# Patient Record
Sex: Female | Born: 1950 | Race: White | Hispanic: No | Smoking: Current every day smoker
Health system: Southern US, Community
[De-identification: ages and names within clinical notes are randomized; demographics above are authoritative.]

## PROBLEM LIST (undated history)

## (undated) DIAGNOSIS — M1990 Unspecified osteoarthritis, unspecified site: Secondary | ICD-10-CM

## (undated) DIAGNOSIS — J439 Emphysema, unspecified: Secondary | ICD-10-CM

## (undated) DIAGNOSIS — F32A Depression, unspecified: Secondary | ICD-10-CM

## (undated) DIAGNOSIS — F329 Major depressive disorder, single episode, unspecified: Secondary | ICD-10-CM

## (undated) DIAGNOSIS — R3911 Hesitancy of micturition: Secondary | ICD-10-CM

## (undated) DIAGNOSIS — R0600 Dyspnea, unspecified: Secondary | ICD-10-CM

## (undated) DIAGNOSIS — F419 Anxiety disorder, unspecified: Secondary | ICD-10-CM

## (undated) DIAGNOSIS — I214 Non-ST elevation (NSTEMI) myocardial infarction: Secondary | ICD-10-CM

## (undated) DIAGNOSIS — J449 Chronic obstructive pulmonary disease, unspecified: Secondary | ICD-10-CM

## (undated) DIAGNOSIS — I10 Essential (primary) hypertension: Secondary | ICD-10-CM

## (undated) HISTORY — PX: NO PAST SURGERIES: SHX2092

---

## 2017-12-02 ENCOUNTER — Encounter (HOSPITAL_COMMUNITY): Payer: Self-pay | Admitting: *Deleted

## 2017-12-02 ENCOUNTER — Inpatient Hospital Stay (HOSPITAL_COMMUNITY)
Admission: AD | Admit: 2017-12-02 | Discharge: 2017-12-05 | DRG: 280 | Disposition: A | Discharge status: Home / Self Care | Payer: Medicaid Other | Source: Other Acute Inpatient Hospital | Attending: Internal Medicine | Admitting: Internal Medicine

## 2017-12-02 ENCOUNTER — Other Ambulatory Visit: Payer: Self-pay

## 2017-12-02 DIAGNOSIS — F17210 Nicotine dependence, cigarettes, uncomplicated: Secondary | ICD-10-CM | POA: Diagnosis present

## 2017-12-02 DIAGNOSIS — E875 Hyperkalemia: Secondary | ICD-10-CM | POA: Diagnosis present

## 2017-12-02 DIAGNOSIS — Z22322 Carrier or suspected carrier of Methicillin resistant Staphylococcus aureus: Secondary | ICD-10-CM

## 2017-12-02 DIAGNOSIS — Z716 Tobacco abuse counseling: Secondary | ICD-10-CM | POA: Diagnosis not present

## 2017-12-02 DIAGNOSIS — D72829 Elevated white blood cell count, unspecified: Secondary | ICD-10-CM | POA: Diagnosis present

## 2017-12-02 DIAGNOSIS — F32A Depression, unspecified: Secondary | ICD-10-CM | POA: Diagnosis present

## 2017-12-02 DIAGNOSIS — I214 Non-ST elevation (NSTEMI) myocardial infarction: Secondary | ICD-10-CM

## 2017-12-02 DIAGNOSIS — J9601 Acute respiratory failure with hypoxia: Secondary | ICD-10-CM

## 2017-12-02 DIAGNOSIS — R739 Hyperglycemia, unspecified: Secondary | ICD-10-CM | POA: Diagnosis present

## 2017-12-02 DIAGNOSIS — F329 Major depressive disorder, single episode, unspecified: Secondary | ICD-10-CM | POA: Diagnosis present

## 2017-12-02 DIAGNOSIS — I10 Essential (primary) hypertension: Secondary | ICD-10-CM | POA: Diagnosis present

## 2017-12-02 DIAGNOSIS — R945 Abnormal results of liver function studies: Secondary | ICD-10-CM

## 2017-12-02 DIAGNOSIS — Z720 Tobacco use: Secondary | ICD-10-CM | POA: Diagnosis not present

## 2017-12-02 DIAGNOSIS — D649 Anemia, unspecified: Secondary | ICD-10-CM | POA: Diagnosis not present

## 2017-12-02 DIAGNOSIS — Z8249 Family history of ischemic heart disease and other diseases of the circulatory system: Secondary | ICD-10-CM | POA: Diagnosis not present

## 2017-12-02 DIAGNOSIS — F419 Anxiety disorder, unspecified: Secondary | ICD-10-CM | POA: Diagnosis present

## 2017-12-02 DIAGNOSIS — R918 Other nonspecific abnormal finding of lung field: Secondary | ICD-10-CM | POA: Diagnosis present

## 2017-12-02 DIAGNOSIS — Z66 Do not resuscitate: Secondary | ICD-10-CM | POA: Diagnosis present

## 2017-12-02 DIAGNOSIS — Z801 Family history of malignant neoplasm of trachea, bronchus and lung: Secondary | ICD-10-CM

## 2017-12-02 DIAGNOSIS — I21A1 Myocardial infarction type 2: Principal | ICD-10-CM | POA: Diagnosis present

## 2017-12-02 DIAGNOSIS — E876 Hypokalemia: Secondary | ICD-10-CM | POA: Diagnosis present

## 2017-12-02 DIAGNOSIS — J9621 Acute and chronic respiratory failure with hypoxia: Secondary | ICD-10-CM | POA: Diagnosis present

## 2017-12-02 DIAGNOSIS — R740 Nonspecific elevation of levels of transaminase and lactic acid dehydrogenase [LDH]: Secondary | ICD-10-CM | POA: Diagnosis present

## 2017-12-02 DIAGNOSIS — R748 Abnormal levels of other serum enzymes: Secondary | ICD-10-CM | POA: Diagnosis not present

## 2017-12-02 DIAGNOSIS — J441 Chronic obstructive pulmonary disease with (acute) exacerbation: Secondary | ICD-10-CM | POA: Diagnosis present

## 2017-12-02 DIAGNOSIS — R0602 Shortness of breath: Secondary | ICD-10-CM | POA: Diagnosis not present

## 2017-12-02 DIAGNOSIS — R7989 Other specified abnormal findings of blood chemistry: Secondary | ICD-10-CM

## 2017-12-02 DIAGNOSIS — R778 Other specified abnormalities of plasma proteins: Secondary | ICD-10-CM

## 2017-12-02 DIAGNOSIS — R0600 Dyspnea, unspecified: Secondary | ICD-10-CM

## 2017-12-02 HISTORY — DX: Depression, unspecified: F32.A

## 2017-12-02 HISTORY — DX: Emphysema, unspecified: J43.9

## 2017-12-02 HISTORY — DX: Anxiety disorder, unspecified: F41.9

## 2017-12-02 HISTORY — DX: Non-ST elevation (NSTEMI) myocardial infarction: I21.4

## 2017-12-02 HISTORY — DX: Dyspnea, unspecified: R06.00

## 2017-12-02 HISTORY — DX: Unspecified osteoarthritis, unspecified site: M19.90

## 2017-12-02 HISTORY — DX: Essential (primary) hypertension: I10

## 2017-12-02 HISTORY — DX: Chronic obstructive pulmonary disease, unspecified: J44.9

## 2017-12-02 HISTORY — DX: Hesitancy of micturition: R39.11

## 2017-12-02 HISTORY — DX: Major depressive disorder, single episode, unspecified: F32.9

## 2017-12-02 LAB — BLOOD GAS, ARTERIAL
Acid-base deficit: 0.4 mmol/L (ref 0.0–2.0)
Bicarbonate: 23.4 mmol/L (ref 20.0–28.0)
DELIVERY SYSTEMS: POSITIVE
DRAWN BY: 249101
Expiratory PAP: 5
FIO2: 30
Inspiratory PAP: 10
LHR: 10 {breaths}/min
O2 Saturation: 98.7 %
PCO2 ART: 35.8 mmHg (ref 32.0–48.0)
Patient temperature: 98.6
pH, Arterial: 7.431 (ref 7.350–7.450)
pO2, Arterial: 136 mmHg — ABNORMAL HIGH (ref 83.0–108.0)

## 2017-12-02 LAB — TROPONIN I: Troponin I: 2.1 ng/mL (ref <0.03)

## 2017-12-02 LAB — BRAIN NATRIURETIC PEPTIDE: B Natriuretic Peptide: 680.9 pg/mL — ABNORMAL HIGH (ref 0.0–100.0)

## 2017-12-02 MED ORDER — HYDRALAZINE HCL 20 MG/ML IJ SOLN: 5.0000 mg | INTRAMUSCULAR | Status: DC | PRN

## 2017-12-02 MED ORDER — ALPRAZOLAM 0.25 MG PO TABS
0.2500 mg | ORAL_TABLET | Freq: Two times a day (BID) | ORAL | Status: DC | PRN
  Administered 2017-12-03: 0.25 mg via ORAL
  Filled 2017-12-02: qty 1

## 2017-12-02 MED ORDER — TRAZODONE HCL 50 MG PO TABS
25.0000 mg | ORAL_TABLET | Freq: Every day | ORAL | Status: DC
  Administered 2017-12-02 – 2017-12-04 (×3): 25 mg via ORAL
  Filled 2017-12-02 (×3): qty 1

## 2017-12-02 MED ORDER — ONDANSETRON HCL 4 MG/2ML IJ SOLN
4.0000 mg | Freq: Four times a day (QID) | INTRAMUSCULAR | Status: DC | PRN
  Administered 2017-12-03: 4 mg via INTRAVENOUS
  Filled 2017-12-02: qty 2

## 2017-12-02 MED ORDER — BUPROPION HCL ER (XL) 150 MG PO TB24
300.0000 mg | ORAL_TABLET | Freq: Every day | ORAL | Status: DC
  Administered 2017-12-03 – 2017-12-05 (×3): 300 mg via ORAL
  Filled 2017-12-02 (×3): qty 2

## 2017-12-02 MED ORDER — MOMETASONE FURO-FORMOTEROL FUM 100-5 MCG/ACT IN AERO
2.0000 | INHALATION_SPRAY | Freq: Two times a day (BID) | RESPIRATORY_TRACT | Status: DC
  Administered 2017-12-03 – 2017-12-05 (×5): 2 via RESPIRATORY_TRACT
  Filled 2017-12-02: qty 8.8

## 2017-12-02 MED ORDER — NICOTINE 21 MG/24HR TD PT24
21.0000 mg | MEDICATED_PATCH | Freq: Every day | TRANSDERMAL | Status: DC
  Administered 2017-12-04: 21 mg via TRANSDERMAL
  Filled 2017-12-02 (×3): qty 1

## 2017-12-02 MED ORDER — DM-GUAIFENESIN ER 30-600 MG PO TB12
1.0000 | ORAL_TABLET | Freq: Two times a day (BID) | ORAL | Status: DC | PRN
  Filled 2017-12-02: qty 1

## 2017-12-02 MED ORDER — ACETAMINOPHEN 325 MG PO TABS: 650.0000 mg | ORAL_TABLET | ORAL | Status: DC | PRN

## 2017-12-02 MED ORDER — HEPARIN BOLUS VIA INFUSION
3500.0000 [IU] | Freq: Once | INTRAVENOUS | Status: AC
  Administered 2017-12-02: 23:00:00 3500 [IU] via INTRAVENOUS
  Filled 2017-12-02: qty 3500

## 2017-12-02 MED ORDER — ZOLPIDEM TARTRATE 5 MG PO TABS: 5.0000 mg | ORAL_TABLET | Freq: Every evening | ORAL | Status: DC | PRN

## 2017-12-02 MED ORDER — MORPHINE SULFATE (PF) 2 MG/ML IV SOLN
1.0000 mg | INTRAVENOUS | Status: DC | PRN
Start: 2017-12-02 — End: 2017-12-05

## 2017-12-02 MED ORDER — NITROGLYCERIN 0.4 MG SL SUBL: 0.4000 mg | SUBLINGUAL_TABLET | SUBLINGUAL | Status: DC | PRN

## 2017-12-02 MED ORDER — ATORVASTATIN CALCIUM 80 MG PO TABS
80.0000 mg | ORAL_TABLET | Freq: Every day | ORAL | Status: DC
  Administered 2017-12-02 – 2017-12-04 (×3): 80 mg via ORAL
  Filled 2017-12-02 (×3): qty 1

## 2017-12-02 MED ORDER — HEPARIN (PORCINE) IN NACL 100-0.45 UNIT/ML-% IJ SOLN
900.0000 [IU]/h | INTRAMUSCULAR | Status: DC
  Administered 2017-12-02: 900 [IU]/h via INTRAVENOUS
  Filled 2017-12-02: qty 250

## 2017-12-02 MED ORDER — ASPIRIN EC 325 MG PO TBEC
325.0000 mg | DELAYED_RELEASE_TABLET | Freq: Every day | ORAL | Status: DC
Start: 2017-12-02 — End: 2017-12-03
  Administered 2017-12-02 – 2017-12-03 (×2): 325 mg via ORAL
  Filled 2017-12-02 (×2): qty 1

## 2017-12-02 MED ORDER — SODIUM CHLORIDE 0.9 % IV SOLN
INTRAVENOUS | Status: DC
  Administered 2017-12-02 – 2017-12-04 (×2): via INTRAVENOUS

## 2017-12-02 MED ORDER — ALBUTEROL SULFATE (2.5 MG/3ML) 0.083% IN NEBU
2.5000 mg | INHALATION_SOLUTION | RESPIRATORY_TRACT | Status: DC | PRN
  Administered 2017-12-05: 2.5 mg via RESPIRATORY_TRACT
  Filled 2017-12-02: qty 3

## 2017-12-02 MED ORDER — IPRATROPIUM-ALBUTEROL 0.5-2.5 (3) MG/3ML IN SOLN
3.0000 mL | RESPIRATORY_TRACT | Status: DC
  Administered 2017-12-02 – 2017-12-03 (×2): 3 mL via RESPIRATORY_TRACT
  Filled 2017-12-02 (×2): qty 3

## 2017-12-02 MED ORDER — SERTRALINE HCL 100 MG PO TABS
100.0000 mg | ORAL_TABLET | Freq: Every day | ORAL | Status: DC
  Administered 2017-12-03 – 2017-12-05 (×3): 100 mg via ORAL
  Filled 2017-12-02 (×3): qty 1

## 2017-12-02 MED ORDER — METHYLPREDNISOLONE SODIUM SUCC 125 MG IJ SOLR
60.0000 mg | Freq: Three times a day (TID) | INTRAMUSCULAR | Status: DC
  Administered 2017-12-02 – 2017-12-03 (×3): 60 mg via INTRAVENOUS
  Filled 2017-12-02 (×3): qty 2

## 2017-12-02 NOTE — H&P (Signed)
History and Physical    Jessica CandleKathy Reichl WUJ:[id number] DOB: 05/08/1951 DOA: 12/02/2017  Referring MD/NP/PA:   PCP: Anselmo PicklerAchreja, Manjeet Kaur, MD   Patient coming from:  The patient is coming from home.  At baseline, pt is independent for most of ADL. SNF  Assistant living facility   Retirement center.       Chief Complaint: SOB  HPI: Jessica Sawyer is a 67 y.o. female with medical history significant of COPD, anxiety disorder, tobacco abuse and depression with no previous CAD history, who presented with SOB.   [facility] due to non-STEMI.  Patient has been having shortness of breath in the past several weeks, which has been progressively getting worse. Patient denies any chest pain. She has dry cough, but no fever or chills.  She has diaphoresis and weakness in both legs.  [facility] and was found to have NSTEMI with trop 0.11-->1.09. Pt is tachypneic and has oxygen desaturation to 88% on room air. She was put on BiPAP which greatly improved her oxygenation and rated breathing. An ABG was checked while on BiPAP noting a pH of 7.34 and a pCO2 of 44 with PO2 of 130. Patient's chest x-ray noted no evidence of infiltrate, but did note emphysematous changes. A procalcitonin level was normal. Lactic acid level at 1.6. Other labs of note was white count of 17 and cre 0.90. Patient was given dose of Solu-Medrol nebulizer treatments and was able to be weaned off the BiPAP on to 2 L nasal cannula oxygen. Reported EKG noted no signs of ST changes. [facility] at [facility] (Dr.Crenshaw) was consulted, who agreed patient should be assessed further and accepted the patient for [facility]. Pt was started with IV heparin.  When I saw pt on the floor, pt patient has shortness of breath and is in respiratory distress. She can barely speaking full sentence. She has dry cough, no chest pain, fever or chills.  She denies nausea, vomiting, diarrhea,  abdominal pain, symptoms of UTI or unilateral weakness. She states that she is absolutely sure that she wants to be DNR.  [facility]: 1. This was a technically difficult study with suboptimal views. 2. Overall left ventricular systolic function is low-normal with, an EF between 50 - 55 %. 3. The diastolic filling pattern indicates impaired relaxation. 4. Basal anteroseptal LV wall motion is akinetic. 5. Mid anteroseptal LV wall motion is akinetic. 6. Apical septum LV wall motion is hypokinetic. 7. Unable to estimate RVSP due to inadequate TR jet spectral doppler profile. 8. There is no pericardial effusion.  Review of Systems:   General: no fevers, chills, no body weight gain, has poor appetite, has fatigue HEENT: no blurry vision, hearing changes or sore throat Respiratory: has dyspnea, coughing, wheezing CV: no chest pain, no palpitations GI: no nausea, vomiting, abdominal pain, diarrhea, constipation GU: no dysuria, burning on urination, increased urinary frequency, hematuria  Ext: no leg edema Neuro: no unilateral weakness, numbness, or tingling, no vision change or hearing loss Skin: no rash, no skin tear. MSK: No muscle spasm, no deformity, no limitation of range of movement in spin Heme: No easy bruising.  Travel history: No recent long distant travel.  Allergy: No Known Allergies  Past Medical History:  Diagnosis Date  . Anxiety   . Arthritis    "neck, hands, shoulders" (12/02/2017)  . COPD (chronic obstructive pulmonary disease) (HCC)   . Depression   .  Dyspnea   . Emphysema lung (HCC)   . Hypertension   . NSTEMI (non-ST elevated myocardial infarction) (HCC) 12/02/2017   /pt transfer form 12/02/2017  . Urinary hesitancy     Past Surgical History:  Procedure Laterality Date  . NO PAST SURGERIES      Social History:  reports that she has been smoking.  She has a 78.00 pack-year smoking history. She has never used smokeless tobacco. She  reports that she drank alcohol. She reports that she does not use drugs.  Family History:  Family History  Problem Relation Age of Onset  . Lung cancer Father   . Breast cancer Sister      Prior to Admission medications   Not on File    Physical Exam: Vitals:   12/02/17 2113 12/02/17 2218  BP: 120/67   Pulse: 91   Resp: (!) 29   Temp: 97.7 F (36.5 C)   TempSrc: Oral   SpO2: 98%   Weight:  63.9 kg (140 lb 14 oz)   General: in moderate respiratory acute distress HEENT:       Eyes: PERRL, EOMI, no scleral icterus.       ENT: No discharge from the ears and nose, no pharynx injection, no tonsillar enlargement.        Neck: No JVD, no bruit, no mass felt. Heme: No neck lymph node enlargement. Cardiac: S1/S2, RRR, No murmurs, No gallops or rubs. Respiratory: decresedd air movement bilaterally, wheezing bilaterally. GI: Soft, nondistended, nontender, no rebound pain, no organomegaly, BS present. GU: No hematuria Ext: No pitting leg edema bilaterally. 2+DP/PT pulse bilaterally. Musculoskeletal: No joint deformities, No joint redness or warmth, no limitation of ROM in spin. Skin: No rashes.  Neuro: Alert, oriented X3, cranial nerves II-XII grossly intact, moves all extremities normally. Psych: Patient is not psychotic, no suicidal or hemocidal ideation.  Labs on Admission: I have personally reviewed following labs and imaging studies  CBC: No results for input(s): WBC, NEUTROABS, HGB, HCT, MCV, PLT in the last 168 hours. Basic Metabolic Panel: No results for input(s): NA, K, CL, CO2, GLUCOSE, BUN, CREATININE, CALCIUM, MG, PHOS in the last 168 hours. GFR: CrCl cannot be calculated (No order found.). Liver Function Tests: No results for input(s): AST, ALT, ALKPHOS, BILITOT, PROT, ALBUMIN in the last 168 hours. No results for input(s): LIPASE, AMYLASE in the last 168 hours. No results for input(s): AMMONIA in the last 168 hours. Coagulation Profile: No results for input(s):  INR, PROTIME in the last 168 hours. Cardiac Enzymes: No results for input(s): CKTOTAL, CKMB, CKMBINDEX, TROPONINI in the last 168 hours. BNP (last 3 results) No results for input(s): PROBNP in the last 8760 hours. HbA1C: No results for input(s): HGBA1C in the last 72 hours. CBG: No results for input(s): GLUCAP in the last 168 hours. Lipid Profile: No results for input(s): CHOL, HDL, LDLCALC, TRIG, CHOLHDL, LDLDIRECT in the last 72 hours. Thyroid Function Tests: No results for input(s): TSH, T4TOTAL, FREET4, T3FREE, THYROIDAB in the last 72 hours. Anemia Panel: No results for input(s): VITAMINB12, FOLATE, FERRITIN, TIBC, IRON, RETICCTPCT in the last 72 hours. Urine analysis: No results found for: COLORURINE, APPEARANCEUR, LABSPEC, PHURINE, GLUCOSEU, HGBUR, BILIRUBINUR, KETONESUR, PROTEINUR, UROBILINOGEN, NITRITE, LEUKOCYTESUR Sepsis Labs: @LABRCNTIP (procalcitonin:4,lacticidven:4) )No results found for this or any previous visit (from the past 240 hour(s)).   Radiological Exams on Admission: No results found.   EKG: will get one now  Assessment/Plan Principal Problem:   NSTEMI (non-ST elevated myocardial infarction) [facility]) Active Problems:  COPD with acute exacerbation (HCC)   Anxiety   Depression   Hypertension   Tobacco abuse   Acute on chronic respiratory failure with hypoxia (HCC)   Non ST elevated MI: trop 0.11-->1.09. Cardiology at [facility]) was consulted-->possible cath on Monday.  The patient does not have chest pain, but has shortness of breath which is likely mainly due to COPD exacerbation.  - will admit to SDU as inpt - cycle CE q6 x3 and repeat EKG  - prn Nitroglycerin, Morphine, and aspirin, lipitor  - Risk factor stratification: will check FLP, UDS and A1C  -f/u card recommendations  Acute on chronic respiratory failure with hypoxia due to acute COPD exacerbation with hypoxia: Normal procalcitonin. CXR has no infiltration, no need for  antibiotics now. -start BiPAP and get ABG -Nebulizers: scheduled Duoneb and prn albuterol -Dulera inhaler -Solu-Medrol 60 mg IV tid -Mucinex for cough  -Incentive spirometry -Follow up blood culture x2, respiratory virus panel -Nasal cannula oxygen as needed to maintain O2 saturation 92% or greater   Hyperglycemia: Noted glucose of 176. No history of diabetes.  -Will check an A1c.   History of anxiety and depression:  -Continue home medications: PRN Xanax, Buproban, Zoloft, trazodone  Tobacco abuse:  -Nicotine patch   Suspicious lymph nodes: per discharge summary from [place], "prior to admission, patient underwent a PET scan done at [facility] on 6/27. Apparently patient had a screening CT which noted bilateral pulmonary nodules. Patient's PET scan noted 3 pulmonary nodules which were hypermetabolic the most suspicious being the right apex and considered for tissue sampling versus follow-up chest CT in 3 months. No evidence of thoracic nodule or hypermetabolic extra thoracic disease. Upon discharge, patient will need to follow up with [facility]" .  Leukocytosis: WBC 17.0.  No fever.  Likely due to steroid use. -Check urinalysis -Blood culture    DVT ppx: On IV Heparin    Code Status: DNR (I discussed with patient, and explained the meaning of CODE STATUS. Patient states that she is absolutely sure that she wants to be DNR) Family Communication: None at bed side.    Disposition Plan:  Anticipate discharge back to previous home environment Consults called:  Card Admission status:  SDU/inpation       Date of Service 12/02/2017    Lorretta Harp [facility] Pager [phone]  If 7PM-7AM, please contact night-coverage [email or web address] Password [facility] 12/02/2017, 10:45 PM

## 2017-12-02 NOTE — Progress Notes (Signed)
ANTICOAGULATION CONSULT NOTE - Initial Consult  Pharmacy Consult for heparin Indication: chest pain/ACS  No Known Allergies  Patient Measurements:   Heparin Dosing Weight:  Vital Signs: Temp: 97.7 F (36.5 C) (06/28 2113) Temp Source: Oral (06/28 2113) BP: 120/67 (06/28 2113) Pulse Rate: 91 (06/28 2113)  Labs: No results for input(s): HGB, HCT, PLT, APTT, LABPROT, INR, HEPARINUNFRC, HEPRLOWMOCWT, CREATININE, CKTOTAL, CKMB, TROPONINI in the last 72 hours.  CrCl cannot be calculated (No order found.).   Medical History: Past Medical History:  Diagnosis Date  . Anxiety   . Arthritis    "neck, hands, shoulders" (12/02/2017)  . COPD (chronic obstructive pulmonary disease) (HCC)   . Depression   . Dyspnea   . Emphysema lung (HCC)   . Hypertension   . NSTEMI (non-ST elevated myocardial infarction) (HCC) 12/02/2017   /pt transfer form 12/02/2017  . Urinary hesitancy    Assessment: 67 year old female admitted for progressive shortness of breath and weakness. Troponin low positive at 0.11 per MD note. Orders to start IV heparin for rule out ACS.   Goal of Therapy:  Heparin level 0.3-0.7 units/ml Monitor platelets by anticoagulation protocol: Yes   Plan:  Give 3500 units bolus x 1 Start heparin infusion at 900 units/hr Check anti-Xa level in 6 hours and daily while on heparin Continue to monitor H&H and platelets  Sheppard CoilFrank Mckinnon Glick PharmD., BCPS Clinical Pharmacist 12/02/2017 10:20 PM

## 2017-12-02 NOTE — Consult Note (Signed)
Cardiology Consultation:   Patient ID: Jessica Sawyer; [id number]; [birth year]   Admit date: 12/02/2017 Date of Consult: 12/02/2017  Primary Care Provider: Anselmo Pickler, MD Primary Cardiologist: Not established Referring Provider: Dr. Clyde Lundborg   Patient Profile:   Jessica Sawyer is a 67 y.o. female with a hx of COPD, tobacco abuse and depression who is being seen today for the evaluation of elevated troponin of 0.11 at the request of Dr. Clyde Lundborg.  History of Present Illness:   Ms. Dipierro was walking down her driveway this morning around 9 AM when she became significantly short of breath. She reports having more shortness of breath over the past couple weeks. She was planning on seeing her PCP this morning but then called EMS after the worsening of her breathing. She has never been hospitalized for any reason. She reports some slight chest tightness but no overt pain and currently feels much better after being given Solumedrol and BIPAP at the outside hospital. She reports a slightly productive cough with clear sputum. She was found to have an elevated troponin of 0.11 and the case was discussed with Dr. Jens Som who recommended transfer and IV heparin until troponin trend is established. She denies any issues with bleeding. She continues to smoke 1.5 ppd. She is listed as DNR.  Past Medical History:  Diagnosis Date  . Anxiety   . Arthritis    "neck, hands, shoulders" (12/02/2017)  . COPD (chronic obstructive pulmonary disease) (HCC)   . Depression   . Dyspnea   . Emphysema lung (HCC)   . Hypertension   . NSTEMI (non-ST elevated myocardial infarction) (HCC) 12/02/2017   /pt transfer form 12/02/2017  . Urinary hesitancy     Past Surgical History:  Procedure Laterality Date  . NO PAST SURGERIES       Inpatient Medications: Scheduled Meds: . aspirin EC  325 mg Oral Daily  . atorvastatin  80 mg Oral q1800  . [START ON 12/03/2017] buPROPion  300 mg Oral Daily  . heparin  3,500 Units  Intravenous Once  . [START ON 12/03/2017] ipratropium-albuterol  3 mL Nebulization Q4H  . methylPREDNISolone (SOLU-MEDROL) injection  60 mg Intravenous TID  . mometasone-formoterol  2 puff Inhalation BID  . [START ON 12/03/2017] nicotine  21 mg Transdermal Daily  . [START ON 12/03/2017] sertraline  100 mg Oral Daily  . traZODone  25 mg Oral QHS   Continuous Infusions: . sodium chloride    . heparin     PRN Meds: acetaminophen, albuterol, ALPRAZolam, dextromethorphan-guaiFENesin, hydrALAZINE, morphine injection, nitroGLYCERIN, ondansetron (ZOFRAN) IV, zolpidem  Allergies:   No Known Allergies  Social History:   Social History   Socioeconomic History  . Marital status: Divorced    Spouse name: Not on file  . Number of children: Not on file  . Years of education: Not on file  . Highest education level: Not on file  Occupational History  . Not on file  Social Needs  . Financial resource strain: Not on file  . Food insecurity:    Worry: Not on file    Inability: Not on file  . Transportation needs:    Medical: Not on file    Non-medical: Not on file  Tobacco Use  . Smoking status: Current Every Day Smoker    Packs/day: 1.50    Years: 52.00    Pack years: 78.00  . Smokeless tobacco: Never Used  Substance and Sexual Activity  . Alcohol use: Not Currently    Frequency: Never  .  Drug use: Never  . Sexual activity: Not Currently    Birth control/protection: None  Lifestyle  . Physical activity:    Days per week: Not on file    Minutes per session: Not on file  . Stress: Not on file  Relationships  . Social connections:    Talks on phone: Not on file    Gets together: Not on file    Attends religious service: Not on file    Active member of club or organization: Not on file    Attends meetings of clubs or organizations: Not on file    Relationship status: Not on file  . Intimate partner violence:    Fear of current or ex partner: Not on file    Emotionally abused: Not  on file    Physically abused: Not on file    Forced sexual activity: Not on file  Other Topics Concern  . Not on file  Social History Narrative  . Not on file     Family History:   Brother: CAD and heart valve Brother: CAD and defibrillator   Family History  Problem Relation Age of Onset  . Lung cancer Father   . Breast cancer Sister      REVIEW OF SYSTEMS (positive in bold, otherwise negative):  Constitutional: change in weight, fever/chills, fatigue, diaphoresis Skin: change in color, rashes, ulcerations or suspicious lesions HENT: head trauma, headache, difficulty hearing, nasal discharge, sore throat Eyes: blurred vision, diplopia Cardiovascular: chest pain, palpitations, syncope, edema, orthopnea, PND Respiratory: pleuritic chest pain, SOB, DOE, cough Gastrointestinal: constipation, abdominal pain, change in stool, nausea, vomiting, diarrhea  Genitourinary: dysuria, hematuria, incontinence, urgency Musculoskeletal: arthritis, muscle stiffness, weakness. Neurological: seizures, focal weakness, sensory change  Physical Exam/Data:   Vitals:   12/02/17 2113 12/02/17 2218  BP: 120/67   Pulse: 91   Resp: (!) 29   Temp: 97.7 F (36.5 C)   TempSrc: Oral   SpO2: 98%   Weight:  63.9 kg (140 lb 14 oz)    Intake/Output Summary (Last 24 hours) at 12/02/2017 2241 Last data filed at 12/02/2017 2122 Gross per 24 hour  Intake -  Output 100 ml  Net -100 ml   Filed Weights   12/02/17 2218  Weight: 63.9 kg (140 lb 14 oz)   General: Alert, cooperative, no distress, appears older than stated age. Frequent movements  Head: Normocephalic, without obvious abnormality, atraumatic.  Eyes: Conjunctivae/corneas clear. PERRL, EOMs intact.   Nose: Nares normal.  Septum midline, Mucosa normal. No drainage or sinus tenderness.  Throat: Lips, mucosa, and tongue normal.  Teeth and gums normal.  Neck: Supple, symmetrical, trachea midline, no adenopathy, no thryroid enlargment, tenderness  or nodules, no carotid bruit and no JVD  Lungs: Diminished breath sounds bilaterally   Heart: Tachycardic, regular, S1, S2 normal, no murmur, click, rub or gallop  Chest Wall: No tenderness.  Abdomen: Soft, non-tender.  Bowel sounds normal.  No masses. No organomegaly.  Extremities: Extremities normal, atraumatic, no cyanosis or edema  Skin: Skin color, texture, turgor normal.  No rashes or lesions.  Neurologic: Nonfocal   Relevant CV Studies: EKG:  NSR with no st changes, HR 86  Echo:  [facility]: 1. This was a technically difficult study with suboptimal views. 2. Overall left ventricular systolic function is low-normal with, an EF between 50 - 55 %. 3. The diastolic filling pattern indicates impaired relaxation. 4. Basal anteroseptal LV wall motion is akinetic. 5. Mid anteroseptal LV wall  motion is akinetic. 6. Apical septum LV wall motion is hypokinetic. 7. Unable to estimate RVSP due to inadequate TR jet spectral doppler profile. 8. There is no pericardial effusion.   Laboratory Data: ABG: 7.34, pCO2 of 44, PO2 130 while on Bipap WBC: 17 Creatinine 0.9 Cardiac Enzymes 0.11 at [facility]  Assessment and Plan:   1. NSTEMI - No overt ST changes by ECG but ECHO suggests basal and mid anteroseptal WMA - Continue to trend tropon's - Continue IV Heparin drip and ASA - Recommend high intensity statin and eventually consider beta blockade - Will consider further invasive testing when respiratory status improves  2. Tobacco abuse - Counseled on cessation   3. Acute on chronic respiratory failure 2/2 Acute COPD exacerbation - Per primary team   For questions or updates, please contact [facility] Please consult [email or web address] for contact info under Cardiology/STEMI.   Signed, Channing Mutters, MD  12/02/2017 10:41 PM

## 2017-12-03 ENCOUNTER — Inpatient Hospital Stay (HOSPITAL_COMMUNITY): Payer: Medicaid Other

## 2017-12-03 LAB — TROPONIN I
TROPONIN I: 1.38 ng/mL — AB (ref <0.03)
Troponin I: 1.68 ng/mL (ref <0.03)

## 2017-12-03 LAB — RESPIRATORY PANEL BY PCR
ADENOVIRUS-RVPPCR: NOT DETECTED
Bordetella pertussis: NOT DETECTED
CORONAVIRUS 229E-RVPPCR: NOT DETECTED
CORONAVIRUS NL63-RVPPCR: NOT DETECTED
CORONAVIRUS OC43-RVPPCR: NOT DETECTED
Chlamydophila pneumoniae: NOT DETECTED
Coronavirus HKU1: NOT DETECTED
INFLUENZA A-RVPPCR: NOT DETECTED
Influenza B: NOT DETECTED
MYCOPLASMA PNEUMONIAE-RVPPCR: NOT DETECTED
Metapneumovirus: NOT DETECTED
PARAINFLUENZA VIRUS 1-RVPPCR: NOT DETECTED
PARAINFLUENZA VIRUS 2-RVPPCR: NOT DETECTED
Parainfluenza Virus 3: NOT DETECTED
Parainfluenza Virus 4: NOT DETECTED
Respiratory Syncytial Virus: NOT DETECTED
Rhinovirus / Enterovirus: NOT DETECTED

## 2017-12-03 LAB — LIPID PANEL
CHOLESTEROL: 259 mg/dL — AB (ref 0–200)
HDL: 93 mg/dL (ref >40)
LDL Cholesterol: 159 mg/dL — ABNORMAL HIGH (ref 0–99)
TRIGLYCERIDES: 35 mg/dL (ref <150)
Total CHOL/HDL Ratio: 2.8 RATIO
VLDL: 7 mg/dL (ref 0–40)

## 2017-12-03 LAB — URINALYSIS, ROUTINE W REFLEX MICROSCOPIC
BACTERIA UA: NONE SEEN
BILIRUBIN URINE: NEGATIVE
Glucose, UA: NEGATIVE mg/dL
KETONES UR: NEGATIVE mg/dL
LEUKOCYTES UA: NEGATIVE
NITRITE: NEGATIVE
PH: 5 (ref 5.0–8.0)
PROTEIN: NEGATIVE mg/dL
Specific Gravity, Urine: 1.012 (ref 1.005–1.030)

## 2017-12-03 LAB — CBC
HCT: 36.3 % (ref 36.0–46.0)
HEMOGLOBIN: 11.7 g/dL — AB (ref 12.0–15.0)
MCH: 31.1 pg (ref 26.0–34.0)
MCHC: 32.2 g/dL (ref 30.0–36.0)
MCV: 96.5 fL (ref 78.0–100.0)
Platelets: 268 10*3/uL (ref 150–400)
RBC: 3.76 MIL/uL — AB (ref 3.87–5.11)
RDW: 13.7 % (ref 11.5–15.5)
WBC: 11.1 10*3/uL — AB (ref 4.0–10.5)

## 2017-12-03 LAB — BASIC METABOLIC PANEL
ANION GAP: 10 (ref 5–15)
BUN: 17 mg/dL (ref 8–23)
CHLORIDE: 102 mmol/L (ref 98–111)
CO2: 25 mmol/L (ref 22–32)
CREATININE: 0.97 mg/dL (ref 0.44–1.00)
Calcium: 8.9 mg/dL (ref 8.9–10.3)
GFR calc non Af Amer: >60 mL/min (ref >60)
Glucose, Bld: 174 mg/dL — ABNORMAL HIGH (ref 70–99)
Potassium: 5.6 mmol/L — ABNORMAL HIGH (ref 3.5–5.1)
SODIUM: 137 mmol/L (ref 135–145)

## 2017-12-03 LAB — RAPID URINE DRUG SCREEN, HOSP PERFORMED
Amphetamines: NOT DETECTED
Benzodiazepines: NOT DETECTED
Cocaine: NOT DETECTED
OPIATES: NOT DETECTED
Tetrahydrocannabinol: NOT DETECTED

## 2017-12-03 LAB — HEMOGLOBIN A1C
HEMOGLOBIN A1C: 5.9 % — AB (ref 4.8–5.6)
Mean Plasma Glucose: 122.63 mg/dL

## 2017-12-03 LAB — HEPARIN LEVEL (UNFRACTIONATED)
HEPARIN UNFRACTIONATED: 0.68 [IU]/mL (ref 0.30–0.70)
Heparin Unfractionated: 1.48 IU/mL — ABNORMAL HIGH (ref 0.30–0.70)

## 2017-12-03 LAB — HIV ANTIBODY (ROUTINE TESTING W REFLEX): HIV Screen 4th Generation wRfx: NONREACTIVE

## 2017-12-03 LAB — MRSA PCR SCREENING: MRSA by PCR: POSITIVE — AB

## 2017-12-03 MED ORDER — CHLORHEXIDINE GLUCONATE CLOTH 2 % EX PADS
6.0000 | MEDICATED_PAD | Freq: Every day | CUTANEOUS | Status: DC
  Administered 2017-12-04 – 2017-12-05 (×2): 6 via TOPICAL

## 2017-12-03 MED ORDER — GUAIFENESIN ER 600 MG PO TB12
600.0000 mg | ORAL_TABLET | Freq: Two times a day (BID) | ORAL | Status: DC
  Administered 2017-12-03 – 2017-12-05 (×4): 600 mg via ORAL
  Filled 2017-12-03 (×4): qty 1

## 2017-12-03 MED ORDER — METHYLPREDNISOLONE SODIUM SUCC 125 MG IJ SOLR
60.0000 mg | Freq: Two times a day (BID) | INTRAMUSCULAR | Status: DC
  Administered 2017-12-04 – 2017-12-05 (×3): 60 mg via INTRAVENOUS
  Filled 2017-12-03 (×3): qty 2

## 2017-12-03 MED ORDER — HEPARIN (PORCINE) IN NACL 100-0.45 UNIT/ML-% IJ SOLN
600.0000 [IU]/h | INTRAMUSCULAR | Status: DC
  Administered 2017-12-03: 750 [IU]/h via INTRAVENOUS
  Filled 2017-12-03: qty 250

## 2017-12-03 MED ORDER — ORAL CARE MOUTH RINSE
15.0000 mL | Freq: Two times a day (BID) | OROMUCOSAL | Status: DC
  Administered 2017-12-03 – 2017-12-04 (×3): 15 mL via OROMUCOSAL

## 2017-12-03 MED ORDER — DOXYCYCLINE HYCLATE 100 MG PO TABS
100.0000 mg | ORAL_TABLET | Freq: Two times a day (BID) | ORAL | Status: DC
  Administered 2017-12-03 – 2017-12-05 (×4): 100 mg via ORAL
  Filled 2017-12-03 (×4): qty 1

## 2017-12-03 MED ORDER — IPRATROPIUM-ALBUTEROL 0.5-2.5 (3) MG/3ML IN SOLN
3.0000 mL | Freq: Three times a day (TID) | RESPIRATORY_TRACT | Status: DC
  Administered 2017-12-03 (×3): 3 mL via RESPIRATORY_TRACT
  Filled 2017-12-03 (×3): qty 3

## 2017-12-03 MED ORDER — ASPIRIN EC 81 MG PO TBEC
81.0000 mg | DELAYED_RELEASE_TABLET | Freq: Every day | ORAL | Status: DC
  Administered 2017-12-04: 81 mg via ORAL
  Filled 2017-12-03: qty 1

## 2017-12-03 MED ORDER — MUPIROCIN 2 % EX OINT
1.0000 "application " | TOPICAL_OINTMENT | Freq: Two times a day (BID) | CUTANEOUS | Status: DC
  Administered 2017-12-03 – 2017-12-05 (×5): 1 via NASAL
  Filled 2017-12-03: qty 22

## 2017-12-03 MED ORDER — CHLORHEXIDINE GLUCONATE 0.12 % MT SOLN
15.0000 mL | Freq: Two times a day (BID) | OROMUCOSAL | Status: DC
  Administered 2017-12-03 – 2017-12-05 (×5): 15 mL via OROMUCOSAL
  Filled 2017-12-03 (×5): qty 15

## 2017-12-03 NOTE — Progress Notes (Signed)
Pt had vomited to previously digested food. Zofran IV given as ordered. EKG done with noted changes compared to previous EKGs. Dr. Garlan FillersMartin,Stephanie  Called and updated . Incoming RN aware.

## 2017-12-03 NOTE — Progress Notes (Signed)
ANTICOAGULATION CONSULT NOTE   Pharmacy Consult for heparin Indication: chest pain/ACS  No Known Allergies  Patient Measurements: Weight: 140 lb 3.4 oz (63.6 kg)  Vital Signs: Temp: 98.8 F (37.1 C) (06/29 2009) Temp Source: Oral (06/29 2009) BP: 164/86 (06/29 2009) Pulse Rate: 113 (06/29 2009)  Labs: Recent Labs    12/02/17 2220 12/03/17 0334 12/03/17 0811 12/03/17 1026 12/03/17 1900  HGB  --  11.7*  --   --   --   HCT  --  36.3  --   --   --   PLT  --  268  --   --   --   HEPARINUNFRC  --   --  1.48*  --  0.68  CREATININE  --  0.97  --   --   --   TROPONINI 2.10* 1.68*  --  1.38*  --    CrCl cannot be calculated (Unknown ideal weight.).  Medical History: Past Medical History:  Diagnosis Date  . Anxiety   . Arthritis    "neck, hands, shoulders" (12/02/2017)  . COPD (chronic obstructive pulmonary disease) (HCC)   . Depression   . Dyspnea   . Emphysema lung (HCC)   . Hypertension   . NSTEMI (non-ST elevated myocardial infarction) (HCC) 12/02/2017   /pt transfer form 12/02/2017  . Urinary hesitancy    Assessment: 67 year old female admitted for progressive shortness of breath and weakness. Troponin positive 1.68  Initial heparin level therapeutic: 0.68, no overt bleeding reported by RN.   Goal of Therapy:  Heparin level 0.3-0.7 units/ml Monitor platelets by anticoagulation protocol: Yes   Plan:  Continue heparin infusion at 750 units/hr Check confirmatory anti-Xa level in 6 hours and daily while on heparin Continue to monitor H&H and platelets  Daren Yeagle A. Jeanella CrazePierce, PharmD, BCPS Clinical Pharmacist [place] Pager: [phone] Please utilize Amion for appropriate phone number to reach the unit pharmacist [facility]   12/03/2017 8:34 PM Please check AMION for all [facility] numbers

## 2017-12-03 NOTE — Progress Notes (Signed)
ANTICOAGULATION CONSULT NOTE   Pharmacy Consult for heparin Indication: chest pain/ACS  No Known Allergies  Patient Measurements: Weight: 176 lb 11.2 oz (80.2 kg)  Vital Signs: Temp: 98 F (36.7 C) (06/29 0801) Temp Source: Oral (06/29 0801) BP: 138/67 (06/29 0801) Pulse Rate: 86 (06/29 0443)  Labs: Recent Labs    12/02/17 2220 12/03/17 0334 12/03/17 0811  HGB  --  11.7*  --   HCT  --  36.3  --   PLT  --  268  --   HEPARINUNFRC  --   --  1.48*  CREATININE  --  0.97  --   TROPONINI 2.10* 1.68*  --    CrCl cannot be calculated (Unknown ideal weight.).  Medical History: Past Medical History:  Diagnosis Date  . Anxiety   . Arthritis    "neck, hands, shoulders" (12/02/2017)  . COPD (chronic obstructive pulmonary disease) (HCC)   . Depression   . Dyspnea   . Emphysema lung (HCC)   . Hypertension   . NSTEMI (non-ST elevated myocardial infarction) (HCC) 12/02/2017   /pt transfer form 12/02/2017  . Urinary hesitancy    Assessment: 67 year old female admitted for progressive shortness of breath and weakness. Troponin positive 1.68  Initial heparin level elevated: 1.48, no overt bleeding reported by RN. Labs collected correctly. Asked to pause infusion and will resume in ~1 hour.  Goal of Therapy:  Heparin level 0.3-0.7 units/ml Monitor platelets by anticoagulation protocol: Yes   Plan:  Reduce heparin infusion to 750 units/hr Check anti-Xa level in 6 hours and daily while on heparin Continue to monitor H&H and platelets  Ruben Imony Huntleigh Doolen, PharmD Clinical Pharmacist 12/03/2017 11:19 AM Please check AMION for all [facility] numbers

## 2017-12-03 NOTE — Progress Notes (Signed)
PROGRESS NOTE  Jessica Sawyer ZOX:[id number] DOB: [birth year] DOA: 12/02/2017 PCP: Anselmo Pickler, MD  HPI/Recap of past 24 hours:   Very pleasant, denies chest pain or sob, no fever, no hypoxia on room air She has intermittent congested cough, she reports it is chronic  Assessment/Plan: Principal Problem:   NSTEMI (non-ST elevated myocardial infarction) (HCC) Active Problems:   COPD with acute exacerbation (HCC)   Anxiety   Depression   Hypertension   Tobacco abuse   Acute on chronic respiratory failure with hypoxia (HCC)  NSTEMI: On heparin drip,asa, statin Cardiology consulted, plan for cardiac cath on Monday, will follow recommendations  Acute hypoxic respiratory failure/copd exacerbation -cxr with copd changes and chronic bronchitis changes -no fever, now on room air at rest -no wheezing today, continue to have congested cough, mrsa screening +, respiratory viral panel negative -continue nebs, taper steroids, add oral doxycycline and mucinex  Mild hyperkalemia: continue hydration, continue nebs, repeat lab  prior to admission, patient underwent a PET scan done at [facility] on 6/27. Apparently patient had a screening CT which noted bilateral pulmonary nodules. Patient's PET scan noted 3 pulmonary nodules which were hypermetabolic the most suspicious being the right apex and considered for tissue sampling versus follow-up chest CT in 3 months. No evidence of thoracic nodule or hypermetabolic extra thoracic disease. Upon discharge, patient will need to follow up with [facility]" .  Tobacco use: Smoking cessation education provided at length>5mins  Code Status: DNR  Family Communication: patient   Disposition Plan: not ready to discharge   Consultants:  cardiology  Procedures:  Plan for cardiac cath on monday  Antibiotics:  Doxycycline from 6/29   Objective: BP 138/67 (BP Location: Right Arm)   Pulse 86   Temp 98 F (36.7  C) (Oral)   Resp 20   Wt 80.2 kg (176 lb 11.2 oz)   SpO2 95%   Intake/Output Summary (Last 24 hours) at 12/03/2017 0811 Last data filed at 12/03/2017 0800 Gross per 24 hour  Intake 660.72 ml  Output 1000 ml  Net -339.28 ml   Filed Weights   12/02/17 2218 12/03/17 0001 12/03/17 0500  Weight: 63.9 kg (140 lb 14 oz) 64.2 kg (141 lb 9.6 oz) 80.2 kg (176 lb 11.2 oz)    Exam: Patient is examined daily including today on 12/03/2017, exams remain the same as of yesterday except that has changed    General:  NAD  Cardiovascular: RRR, distance heart sound  Respiratory: diminished, no wheezing  Abdomen: Soft/ND/NT, positive BS  Musculoskeletal: No Edema  Neuro: alert, oriented   Data Reviewed: Basic Metabolic Panel: Recent Labs  Lab 12/03/17 0334  NA 137  K 5.6*  CL 102  CO2 25  GLUCOSE 174*  BUN 17  CREATININE 0.97  CALCIUM 8.9   Liver Function Tests: No results for input(s): AST, ALT, ALKPHOS, BILITOT, PROT, ALBUMIN in the last 168 hours. No results for input(s): LIPASE, AMYLASE in the last 168 hours. No results for input(s): AMMONIA in the last 168 hours. CBC: Recent Labs  Lab 12/03/17 0334  WBC 11.1*  HGB 11.7*  HCT 36.3  MCV 96.5  PLT 268   Cardiac Enzymes:   Recent Labs  Lab 12/02/17 2220 12/03/17 0334  TROPONINI 2.10* 1.68*   BNP (last 3 results) Recent Labs    12/02/17 2220  BNP 680.9*    ProBNP (last 3 results) No results for input(s): PROBNP in the last 8760 hours.  CBG: No results  for input(s): GLUCAP in the last 168 hours.  Recent Results (from the past 240 hour(s))  Respiratory Panel by PCR     Status: None   Collection Time: 12/02/17 10:51 PM  Result Value Ref Range Status   Adenovirus NOT DETECTED NOT DETECTED Final   Coronavirus 229E NOT DETECTED NOT DETECTED Final   Coronavirus HKU1 NOT DETECTED NOT DETECTED Final   Coronavirus NL63 NOT DETECTED NOT DETECTED Final   Coronavirus OC43 NOT DETECTED NOT DETECTED Final    Metapneumovirus NOT DETECTED NOT DETECTED Final   Rhinovirus / Enterovirus NOT DETECTED NOT DETECTED Final   Influenza A NOT DETECTED NOT DETECTED Final   Influenza B NOT DETECTED NOT DETECTED Final   Parainfluenza Virus 1 NOT DETECTED NOT DETECTED Final   Parainfluenza Virus 2 NOT DETECTED NOT DETECTED Final   Parainfluenza Virus 3 NOT DETECTED NOT DETECTED Final   Parainfluenza Virus 4 NOT DETECTED NOT DETECTED Final   Respiratory Syncytial Virus NOT DETECTED NOT DETECTED Final   Bordetella pertussis NOT DETECTED NOT DETECTED Final   Chlamydophila pneumoniae NOT DETECTED NOT DETECTED Final   Mycoplasma pneumoniae NOT DETECTED NOT DETECTED Final  MRSA PCR Screening     Status: Abnormal   Collection Time: 12/02/17 10:51 PM  Result Value Ref Range Status   MRSA by PCR POSITIVE (A) NEGATIVE Final    Comment:        The GeneXpert MRSA Assay (FDA approved for NASAL specimens only), is one component of a comprehensive MRSA colonization surveillance program. It is not intended to diagnose MRSA infection nor to guide or monitor treatment for MRSA infections. RESULT CALLED TO, READ BACK BY AND VERIFIED WITH: D DUVALL RN 12/03/17 0258 JDW      Studies: No results found.  Scheduled Meds: . aspirin EC  325 mg Oral Daily  . atorvastatin  80 mg Oral q1800  . buPROPion  300 mg Oral Daily  . chlorhexidine  15 mL Mouth Rinse BID  . [START ON 12/04/2017] Chlorhexidine Gluconate Cloth  6 each Topical Q0600  . ipratropium-albuterol  3 mL Nebulization TID  . mouth rinse  15 mL Mouth Rinse q12n4p  . methylPREDNISolone (SOLU-MEDROL) injection  60 mg Intravenous TID  . mometasone-formoterol  2 puff Inhalation BID  . mupirocin ointment  1 application Nasal BID  . nicotine  21 mg Transdermal Daily  . sertraline  100 mg Oral Daily  . traZODone  25 mg Oral QHS    Continuous Infusions: . sodium chloride 75 mL/hr at 12/02/17 2259  . heparin 900 Units/hr (12/02/17 2306)     Time spent:  25mins I have personally reviewed and interpreted on  12/03/2017 daily labs, tele strips, imagings as discussed above under date review session and assessment and plans.  I reviewed all nursing notes, pharmacy notes, consultant notes,  vitals, pertinent old records  I have discussed plan of care as described above with RN , patient on 12/03/2017   Albertine GratesFang Issiah Huffaker MD, PhD  [facility] Pager [phone]. If 7PM-7AM, please contact night-coverage at [email or web address], password [email or web address] 12/03/2017, 8:11 AM  LOS: 1 day

## 2017-12-03 NOTE — Progress Notes (Signed)
Progress Note  Patient Name: Jessica Sawyer Date of Encounter: 12/03/2017  Primary Cardiologist: New  Subjective   SOB improving.   Inpatient Medications    Scheduled Meds: . aspirin EC  325 mg Oral Daily  . atorvastatin  80 mg Oral q1800  . buPROPion  300 mg Oral Daily  . chlorhexidine  15 mL Mouth Rinse BID  . [START ON 12/04/2017] Chlorhexidine Gluconate Cloth  6 each Topical Q0600  . ipratropium-albuterol  3 mL Nebulization TID  . mouth rinse  15 mL Mouth Rinse q12n4p  . methylPREDNISolone (SOLU-MEDROL) injection  60 mg Intravenous TID  . mometasone-formoterol  2 puff Inhalation BID  . mupirocin ointment  1 application Nasal BID  . nicotine  21 mg Transdermal Daily  . sertraline  100 mg Oral Daily  . traZODone  25 mg Oral QHS   Continuous Infusions: . sodium chloride 75 mL/hr at 12/02/17 2259  . heparin 900 Units/hr (12/02/17 2306)   PRN Meds: acetaminophen, albuterol, ALPRAZolam, dextromethorphan-guaiFENesin, hydrALAZINE, morphine injection, nitroGLYCERIN, ondansetron (ZOFRAN) IV, zolpidem   Vital Signs    Vitals:   12/03/17 0500 12/03/17 0801 12/03/17 0904 12/03/17 0908  BP:  138/67    Pulse:      Resp:      Temp:  98 F (36.7 C)    TempSrc:  Oral    SpO2:  95% 97% 100%  Weight: 176 lb 11.2 oz (80.2 kg)       Intake/Output Summary (Last 24 hours) at 12/03/2017 1054 Last data filed at 12/03/2017 0800 Gross per 24 hour  Intake 660.72 ml  Output 1000 ml  Net -339.28 ml   Filed Weights   12/02/17 2218 12/03/17 0001 12/03/17 0500  Weight: 140 lb 14 oz (63.9 kg) 141 lb 9.6 oz (64.2 kg) 176 lb 11.2 oz (80.2 kg)    Telemetry    SR - Personally Reviewed  ECG    na  Physical Exam   GEN: No acute distress.   Neck: No JVD Cardiac: RRR, no murmurs, rubs, or gallops.  Respiratory: bilateral wheezing. GI: Soft, nontender, non-distended  MS: No edema; No deformity. Neuro:  Nonfocal  Psych: Normal affect   Labs    Chemistry Recent Labs  Lab  12/03/17 0334  NA 137  K 5.6*  CL 102  CO2 25  GLUCOSE 174*  BUN 17  CREATININE 0.97  CALCIUM 8.9  GFRNONAA >60  GFRAA >60  ANIONGAP 10     Hematology Recent Labs  Lab 12/03/17 0334  WBC 11.1*  RBC 3.76*  HGB 11.7*  HCT 36.3  MCV 96.5  MCH 31.1  MCHC 32.2  RDW 13.7  PLT 268    Cardiac Enzymes Recent Labs  Lab 12/02/17 2220 12/03/17 0334  TROPONINI 2.10* 1.68*   No results for input(s): TROPIPOC in the last 168 hours.   BNP Recent Labs  Lab 12/02/17 2220  BNP 680.9*     DDimer No results for input(s): DDIMER in the last 168 hours.   Radiology    No results found.  Cardiac Studies   11[facility]2017 [facility] [place] [facility]: 1. This was a technically difficult study with suboptimal views. 2. Overall left ventricular systolic function is low-normal with, an EF between 50 - 55 %. 3. The diastolic filling pattern indicates impaired relaxation. 4. Basal anteroseptal LV wall motion is akinetic. 5. Mid anteroseptal LV wall motion is akinetic. 6. Apical septum LV wall motion is hypokinetic. 7. Unable to estimate  RVSP due to inadequate TR jet spectral doppler profile. 8. There is no pericardial effusion.     Patient Profile     Jessica Sawyer is a 67 y.o. female with a hx of COPD, tobacco abuse and depression who is being seen today for the evaluation of elevated troponin of 0.11 at the request of Dr. Clyde LundborgNiu.    Assessment & Plan    1. NSTEMI - presented with SOB over the last few weeks, acutely worst day of admissoin. +chest tightness - [facility] initially on bipap. Trop at that time 0.11 - peak trop 2.10, trending down. EKG SR, no acute ischemic changes - [facility] LVEF 50-55%,  Anteroseptal and apical  akinesis  - medical therapy with ASA 81, atorva 80, hep gtt. HOld beta blocker due to bronchospasm, hold ACE/ARb due to hypokalemia.  - plan for cath Monday- family reports [facility]  [facility]. May need to consider BMS if results will require biopsy.    2. COPD exacerbation - per primary team  3. Lung nodules - family reports [facility]. May need to consider BMS if results will require biopsy. I have placed order to request results.  - defer to primary team.   DNR status    For questions or updates, please contact [facility] Please consult [email or web address] for contact info under Cardiology/STEMI.      Jessica CoddingtonSigned, Jessica Lanphier, MD  12/03/2017, 10:54 AM

## 2017-12-04 LAB — COMPREHENSIVE METABOLIC PANEL
ALK PHOS: 52 U/L (ref 38–126)
ALT: 43 U/L (ref 0–44)
AST: 60 U/L — ABNORMAL HIGH (ref 15–41)
Albumin: 3.4 g/dL — ABNORMAL LOW (ref 3.5–5.0)
Anion gap: 7 (ref 5–15)
BILIRUBIN TOTAL: 0.5 mg/dL (ref 0.3–1.2)
BUN: 23 mg/dL (ref 8–23)
CO2: 24 mmol/L (ref 22–32)
CREATININE: 0.87 mg/dL (ref 0.44–1.00)
Calcium: 8.2 mg/dL — ABNORMAL LOW (ref 8.9–10.3)
Chloride: 110 mmol/L (ref 98–111)
GFR calc Af Amer: >60 mL/min (ref >60)
GFR calc non Af Amer: >60 mL/min (ref >60)
GLUCOSE: 161 mg/dL — AB (ref 70–99)
POTASSIUM: 4.6 mmol/L (ref 3.5–5.1)
Sodium: 141 mmol/L (ref 135–145)
TOTAL PROTEIN: 5.4 g/dL — AB (ref 6.5–8.1)

## 2017-12-04 LAB — CBC
HEMATOCRIT: 31.7 % — AB (ref 36.0–46.0)
HEMOGLOBIN: 10.2 g/dL — AB (ref 12.0–15.0)
MCH: 31.3 pg (ref 26.0–34.0)
MCHC: 32.2 g/dL (ref 30.0–36.0)
MCV: 97.2 fL (ref 78.0–100.0)
Platelets: 265 10*3/uL (ref 150–400)
RBC: 3.26 MIL/uL — ABNORMAL LOW (ref 3.87–5.11)
RDW: 14.4 % (ref 11.5–15.5)
WBC: 17.5 10*3/uL — ABNORMAL HIGH (ref 4.0–10.5)

## 2017-12-04 LAB — HEPARIN LEVEL (UNFRACTIONATED)
HEPARIN UNFRACTIONATED: 0.43 [IU]/mL (ref 0.30–0.70)
Heparin Unfractionated: 0.8 IU/mL — ABNORMAL HIGH (ref 0.30–0.70)
Heparin Unfractionated: 0.55 IU/mL (ref 0.30–0.70)

## 2017-12-04 LAB — TSH: TSH: 0.283 u[IU]/mL — ABNORMAL LOW (ref 0.350–4.500)

## 2017-12-04 MED ORDER — SODIUM CHLORIDE 0.9 % WEIGHT BASED INFUSION
1.0000 mL/kg/h | INTRAVENOUS | Status: DC
  Administered 2017-12-05: 1 mL/kg/h via INTRAVENOUS

## 2017-12-04 MED ORDER — LISINOPRIL 2.5 MG PO TABS
2.5000 mg | ORAL_TABLET | Freq: Every day | ORAL | Status: DC
  Administered 2017-12-04 – 2017-12-05 (×2): 2.5 mg via ORAL
  Filled 2017-12-04 (×2): qty 1

## 2017-12-04 MED ORDER — SODIUM CHLORIDE 0.9 % IV SOLN: 250.0000 mL | INTRAVENOUS | Status: DC | PRN

## 2017-12-04 MED ORDER — METOPROLOL TARTRATE 12.5 MG HALF TABLET
12.5000 mg | ORAL_TABLET | Freq: Two times a day (BID) | ORAL | Status: DC
  Administered 2017-12-04 – 2017-12-05 (×3): 12.5 mg via ORAL
  Filled 2017-12-04 (×3): qty 1

## 2017-12-04 MED ORDER — SODIUM CHLORIDE 0.9% FLUSH
3.0000 mL | Freq: Two times a day (BID) | INTRAVENOUS | Status: DC
  Administered 2017-12-04: 3 mL via INTRAVENOUS

## 2017-12-04 MED ORDER — SODIUM CHLORIDE 0.9 % WEIGHT BASED INFUSION
3.0000 mL/kg/h | INTRAVENOUS | Status: DC
  Administered 2017-12-05: 3 mL/kg/h via INTRAVENOUS

## 2017-12-04 MED ORDER — IPRATROPIUM-ALBUTEROL 0.5-2.5 (3) MG/3ML IN SOLN
3.0000 mL | Freq: Four times a day (QID) | RESPIRATORY_TRACT | Status: DC | PRN
  Administered 2017-12-04: 3 mL via RESPIRATORY_TRACT
  Filled 2017-12-04: qty 3

## 2017-12-04 MED ORDER — SODIUM CHLORIDE 0.9% FLUSH: 3.0000 mL | INTRAVENOUS | Status: DC | PRN

## 2017-12-04 MED ORDER — ASPIRIN 81 MG PO CHEW
81.0000 mg | CHEWABLE_TABLET | ORAL | Status: AC
  Administered 2017-12-05: 81 mg via ORAL
  Filled 2017-12-04: qty 1

## 2017-12-04 NOTE — Progress Notes (Signed)
PROGRESS NOTE  Jessica Sawyer MVH:[id number] DOB: 01/14/1951 DOA: 12/02/2017 PCP: Anselmo PicklerAchreja, Manjeet Kaur, MD  HPI/Recap of past 24 hours:  She reports vomited x1 yesterday, today no n/v, no ab pain  She Reports feeling better,   no cough noticed during encounter today She denies chest pain, she is on room air at rest, not in acute distress No edema   Assessment/Plan: Principal Problem:   NSTEMI (non-ST elevated myocardial infarction) [facility](HCC) Active Problems:   COPD with acute exacerbation (HCC)   Anxiety   Depression   Hypertension   Tobacco abuse   Acute on chronic respiratory failure with hypoxia (HCC)  NSTEMI: On heparin drip,asa, statin Cardiology started low dose lopressor and lisinopril on 6/30  plan for cardiac cath on Monday, will follow recommendations  Acute hypoxic respiratory failure/copd exacerbation -cxr with copd changes and chronic bronchitis changes -no fever, now on room air at rest -mrsa screening +, respiratory viral panel negative -continue nebs,  oral doxycycline and mucinex. Continue solumedrol bid , she has slight wheezing today   Mild hyperkalemia: k on presentation was 5.6, k normalized after continue hydration, and nebs   Leukocytosis Likely from the steroids Repeat lab in am  Mild elevated transaminitis Check hepatitis panel Consider ab us monitor  Normocytic anemia: Mild hgb 10-11 monitor   prior to admission, patient [facility]. Apparently patient had a screening CT which noted bilateral pulmonary nodules. Patient's PET scan noted 3 pulmonary nodules which were hypermetabolic the most suspicious being the right apex and considered for tissue sampling versus follow-up chest CT in 3 months. No evidence of thoracic nodule or hypermetabolic extra thoracic disease. Upon discharge, [facility] .  Tobacco use: Smoking cessation education  provided at length>253mins  Code Status: DNR  Family Communication: patient   Disposition Plan: not ready to discharge   Consultants:  cardiology  Procedures:  Plan for cardiac cath on monday  Antibiotics:  Doxycycline from 6/29   Objective: BP (!) 148/65 (BP Location: Right Arm)   Pulse 72   Temp 98.1 F (36.7 C) (Oral)   Resp 13   Wt 63.6 kg (140 lb 4.8 oz)   SpO2 98%   Intake/Output Summary (Last 24 hours) at 12/04/2017 0828 Last data filed at 12/04/2017 0755 Gross per 24 hour  Intake 1282.89 ml  Output 1800 ml  Net -517.11 ml   Filed Weights   12/03/17 0500 12/03/17 1500 12/04/17 0608  Weight: 80.2 kg (176 lb 11.2 oz) 63.6 kg (140 lb 3.4 oz) 63.6 kg (140 lb 4.8 oz)    Exam: Patient is examined daily including today on 12/04/2017, exams remain the same as of yesterday except that has changed    General:  NAD  Cardiovascular: RRR, distance heart sound  Respiratory: improved aeration, mild bilateral wheezing  Abdomen: Soft/ND/NT, positive BS  Musculoskeletal: No Edema  Neuro: alert, oriented   Data Reviewed: Basic Metabolic Panel: Recent Labs  Lab 12/03/17 0334 12/04/17 0229  NA 137 141  K 5.6* 4.6  CL 102 110  CO2 25 24  GLUCOSE 174* 161*  BUN 17 23  CREATININE 0.97 0.87  CALCIUM 8.9 8.2*   Liver Function Tests: Recent Labs  Lab 12/04/17 0229  AST 60*  ALT 43  ALKPHOS 52  BILITOT 0.5  PROT 5.4*  ALBUMIN 3.4*   No results for input(s): LIPASE, AMYLASE in the last 168 hours. No results  for input(s): AMMONIA in the last 168 hours. CBC: Recent Labs  Lab 12/03/17 0334 12/04/17 0229  WBC 11.1* 17.5*  HGB 11.7* 10.2*  HCT 36.3 31.7*  MCV 96.5 97.2  PLT 268 265   Cardiac Enzymes:   Recent Labs  Lab 12/02/17 2220 12/03/17 0334 12/03/17 1026  TROPONINI 2.10* 1.68* 1.38*   BNP (last 3 results) Recent Labs    12/02/17 2220  BNP 680.9*    ProBNP (last 3 results) No results for input(s): PROBNP in the last 8760  hours.  CBG: No results for input(s): GLUCAP in the last 168 hours.  Recent Results (from the past 240 hour(s))  Respiratory Panel by PCR     Status: None   Collection Time: 12/02/17 10:51 PM  Result Value Ref Range Status   Adenovirus NOT DETECTED NOT DETECTED Final   Coronavirus 229E NOT DETECTED NOT DETECTED Final   Coronavirus HKU1 NOT DETECTED NOT DETECTED Final   Coronavirus NL63 NOT DETECTED NOT DETECTED Final   Coronavirus OC43 NOT DETECTED NOT DETECTED Final   Metapneumovirus NOT DETECTED NOT DETECTED Final   Rhinovirus / Enterovirus NOT DETECTED NOT DETECTED Final   Influenza A NOT DETECTED NOT DETECTED Final   Influenza B NOT DETECTED NOT DETECTED Final   Parainfluenza Virus 1 NOT DETECTED NOT DETECTED Final   Parainfluenza Virus 2 NOT DETECTED NOT DETECTED Final   Parainfluenza Virus 3 NOT DETECTED NOT DETECTED Final   Parainfluenza Virus 4 NOT DETECTED NOT DETECTED Final   Respiratory Syncytial Virus NOT DETECTED NOT DETECTED Final   Bordetella pertussis NOT DETECTED NOT DETECTED Final   Chlamydophila pneumoniae NOT DETECTED NOT DETECTED Final   Mycoplasma pneumoniae NOT DETECTED NOT DETECTED Final  MRSA PCR Screening     Status: Abnormal   Collection Time: 12/02/17 10:51 PM  Result Value Ref Range Status   MRSA by PCR POSITIVE (A) NEGATIVE Final    Comment:        The GeneXpert MRSA Assay (FDA approved for NASAL specimens only), is one component of a comprehensive MRSA colonization surveillance program. It is not intended to diagnose MRSA infection nor to guide or monitor treatment for MRSA infections. RESULT CALLED TO, READ BACK BY AND VERIFIED WITHMaida Sale RN 12/03/17 0258 JDW      Studies: Dg Chest Port 1 View  Result Date: 12/03/2017 CLINICAL DATA:  Dyspnea.  Smoker. EXAM: PORTABLE CHEST 1 VIEW COMPARISON:  None. FINDINGS: Normal sized heart. Clear lungs. The lungs are hyperexpanded minimal biapical pleural and parenchymal scarring. Mild diffuse  peribronchial thickening. Moderate right AC joint degenerative changes. IMPRESSION: No acute abnormality.  Changes of COPD and chronic bronchitis. Electronically Signed   By: Beckie Salts M.D.   On: 12/03/2017 13:01    Scheduled Meds: . aspirin EC  81 mg Oral Daily  . atorvastatin  80 mg Oral q1800  . buPROPion  300 mg Oral Daily  . chlorhexidine  15 mL Mouth Rinse BID  . Chlorhexidine Gluconate Cloth  6 each Topical Q0600  . doxycycline  100 mg Oral Q12H  . guaiFENesin  600 mg Oral BID  . mouth rinse  15 mL Mouth Rinse q12n4p  . methylPREDNISolone (SOLU-MEDROL) injection  60 mg Intravenous Q12H  . mometasone-formoterol  2 puff Inhalation BID  . mupirocin ointment  1 application Nasal BID  . nicotine  21 mg Transdermal Daily  . sertraline  100 mg Oral Daily  . traZODone  25 mg Oral QHS    Continuous Infusions: .  sodium chloride 75 mL/hr at 12/04/17 0636  . heparin 600 Units/hr (12/04/17 0417)     Time spent: I have personally reviewed and interpreted on  12/04/2017 daily labs, tele strips, imagings as discussed above under date review session and assessment and plans.  I reviewed all nursing notes, pharmacy notes, consultant notes,  vitals, pertinent old records  I have discussed plan of care as described above with RN , patient on 12/04/2017   Albertine Grates MD, PhD  [facility] Pager [phone]. If 7PM-7AM, please contact night-coverage at [email or web address], password [facility] 12/04/2017, 8:28 AM  LOS: 2 days

## 2017-12-04 NOTE — Progress Notes (Signed)
ANTICOAGULATION CONSULT NOTE - Follow Up Consult  Pharmacy Consult for heparin Indication: NSTEMI  Labs: Recent Labs    12/02/17 2220 12/03/17 0334 12/03/17 0811 12/03/17 1026 12/03/17 1900 12/04/17 0229  HGB  --  11.7*  --   --   --  10.2*  HCT  --  36.3  --   --   --  31.7*  PLT  --  268  --   --   --  265  HEPARINUNFRC  --   --  1.48*  --  0.68 0.80*  CREATININE  --  0.97  --   --   --  0.87  TROPONINI 2.10* 1.68*  --  1.38*  --   --     Assessment: 67yo female again supratherapeutic on heparin after one level at upper end of goal; Hgb trending down but RN notes no overt signs of bleeding.  Goal of Therapy:  Heparin level 0.3-0.7 units/ml   Plan:  Will decrease heparin gtt by 2 units/kg/hr to 600 units/hr and check level in 6 hours.    Vernard GamblesVeronda Daviana Haymaker, PharmD, BCPS  12/04/2017,3:39 AM

## 2017-12-04 NOTE — Progress Notes (Addendum)
ANTICOAGULATION CONSULT NOTE   Pharmacy Consult for heparin Indication: chest pain/ACS  No Known Allergies  Patient Measurements: Weight: 140 lb 4.8 oz (63.6 kg)  Vital Signs: Temp: 98.1 F (36.7 C) (06/30 0753) Temp Source: Oral (06/30 0753) BP: 138/73 (06/30 1028) Pulse Rate: 75 (06/30 1028)  Labs: Recent Labs    12/02/17 2220 12/03/17 0334  12/03/17 1026 12/03/17 1900 12/04/17 0229 12/04/17 1008  HGB  --  11.7*  --   --   --  10.2*  --   HCT  --  36.3  --   --   --  31.7*  --   PLT  --  268  --   --   --  265  --   HEPARINUNFRC  --   --    < >  --  0.68 0.80* 0.55  CREATININE  --  0.97  --   --   --  0.87  --   TROPONINI 2.10* 1.68*  --  1.38*  --   --   --    < > = values in this interval not displayed.   CrCl cannot be calculated (Unknown ideal weight.).  Medical History: Past Medical History:  Diagnosis Date  . Anxiety   . Arthritis    "neck, hands, shoulders" (12/02/2017)  . COPD (chronic obstructive pulmonary disease) (HCC)   . Depression   . Dyspnea   . Emphysema lung (HCC)   . Hypertension   . NSTEMI (non-ST elevated myocardial infarction) (HCC) 12/02/2017   /pt transfer form 12/02/2017  . Urinary hesitancy    Assessment: 67 year old female admitted for progressive shortness of breath and weakness. Troponin positive 1.68. Awaiting cardiac cath 7/1.  Heparin level therapeutic: 0.55, no overt bleeding reported by RN.   Goal of Therapy:  Heparin level 0.3-0.7 units/ml Monitor platelets by anticoagulation protocol: Yes   Plan:  Continue heparin infusion at 600 units/hr Check confirmatory anti-Xa level in 6 hours and daily while on heparin Continue to monitor H&H and platelets  Ruben Imony Santrice Muzio, PharmD Clinical Pharmacist 12/04/2017 12:11 PM Please check AMION for all [facility] numbers   _________________________________________________________ Addendum:  Confirmatory level remains therapeutic: 0.43, no overt bleeding or infusion issues  reported  Ruben Imony Nayden Czajka, PharmD Clinical Pharmacist 12/04/2017 6:26 PM Please check AMION for all [facility] numbers

## 2017-12-04 NOTE — Progress Notes (Signed)
Progress Note  Patient Name: Jessica Sawyer Date of Encounter: 12/04/2017  Primary Cardiologist: new  Subjective   No complaints  Inpatient Medications    Scheduled Meds: . aspirin EC  81 mg Oral Daily  . atorvastatin  80 mg Oral q1800  . buPROPion  300 mg Oral Daily  . chlorhexidine  15 mL Mouth Rinse BID  . Chlorhexidine Gluconate Cloth  6 each Topical Q0600  . doxycycline  100 mg Oral Q12H  . guaiFENesin  600 mg Oral BID  . mouth rinse  15 mL Mouth Rinse q12n4p  . methylPREDNISolone (SOLU-MEDROL) injection  60 mg Intravenous Q12H  . mometasone-formoterol  2 puff Inhalation BID  . mupirocin ointment  1 application Nasal BID  . nicotine  21 mg Transdermal Daily  . sertraline  100 mg Oral Daily  . traZODone  25 mg Oral QHS   Continuous Infusions: . sodium chloride 75 mL/hr at 12/04/17 0636  . heparin 600 Units/hr (12/04/17 0417)   PRN Meds: acetaminophen, albuterol, ALPRAZolam, dextromethorphan-guaiFENesin, hydrALAZINE, ipratropium-albuterol, morphine injection, nitroGLYCERIN, ondansetron (ZOFRAN) IV, zolpidem   Vital Signs    Vitals:   12/04/17 0014 12/04/17 0608 12/04/17 0753 12/04/17 0827  BP: (!) 124/55 137/63 (!) 148/65   Pulse: 88 72    Resp: (!) 21 13    Temp: 99.8 F (37.7 C) 98 F (36.7 C) 98.1 F (36.7 C)   TempSrc: Oral Oral Oral   SpO2: 95% 96% 92% 98%  Weight:  140 lb 4.8 oz (63.6 kg)      Intake/Output Summary (Last 24 hours) at 12/04/2017 0945 Last data filed at 12/04/2017 0939 Gross per 24 hour  Intake 1282.89 ml  Output 1900 ml  Net -617.11 ml   Filed Weights   12/03/17 0500 12/03/17 1500 12/04/17 0608  Weight: 176 lb 11.2 oz (80.2 kg) 140 lb 3.4 oz (63.6 kg) 140 lb 4.8 oz (63.6 kg)    Telemetry    SR - Personally Reviewed  ECG    na  Physical Exam   GEN: No acute distress.   Neck: No JVD Cardiac: RRR, no murmurs, rubs, or gallops.  Respiratory: Clear to auscultation bilaterally. GI: Soft, nontender, non-distended  MS:  No edema; No deformity. Neuro:  Nonfocal  Psych: Normal affect   Labs    Chemistry Recent Labs  Lab 12/03/17 0334 12/04/17 0229  NA 137 141  K 5.6* 4.6  CL 102 110  CO2 25 24  GLUCOSE 174* 161*  BUN 17 23  CREATININE 0.97 0.87  CALCIUM 8.9 8.2*  PROT  --  5.4*  ALBUMIN  --  3.4*  AST  --  60*  ALT  --  43  ALKPHOS  --  52  BILITOT  --  0.5  GFRNONAA >60 >60  GFRAA >60 >60  ANIONGAP 10 7     Hematology Recent Labs  Lab 12/03/17 0334 12/04/17 0229  WBC 11.1* 17.5*  RBC 3.76* 3.26*  HGB 11.7* 10.2*  HCT 36.3 31.7*  MCV 96.5 97.2  MCH 31.1 31.3  MCHC 32.2 32.2  RDW 13.7 14.4  PLT 268 265    Cardiac Enzymes Recent Labs  Lab 12/02/17 2220 12/03/17 0334 12/03/17 1026  TROPONINI 2.10* 1.68* 1.38*   No results for input(s): TROPIPOC in the last 168 hours.   BNP Recent Labs  Lab 12/02/17 2220  BNP 680.9*     DDimer No results for input(s): DDIMER in the last 168 hours.   Radiology    Dg  Chest Port 1 View  Result Date: 12/03/2017 CLINICAL DATA:  Dyspnea.  Smoker. EXAM: PORTABLE CHEST 1 VIEW COMPARISON:  None. FINDINGS: Normal sized heart. Clear lungs. The lungs are hyperexpanded minimal biapical pleural and parenchymal scarring. Mild diffuse peribronchial thickening. Moderate right AC joint degenerative changes. IMPRESSION: No acute abnormality.  Changes of COPD and chronic bronchitis. Electronically Signed   By: Beckie SaltsSteven  Reid M.D.   On: 12/03/2017 13:01    Cardiac Studies    Patient Profile     Gaspar SkeetersKathy Williamsis a [age] y.o.femalewith a hx of COPD, tobacco abuse and depressionwho is being seen today for the evaluation of elevated troponin of 0.11at the request of Dr. Clyde LundborgNiu.    Assessment & Plan    1. NSTEMI - presented with SOB over the last few weeks, acutely worst day of admissoin. +chest tightness - [facility]. Trop at that time 0.11 - peak trop 2.10, trending down. EKG SR, no acute ischemic changes -  [facility] 50-55%,  Anteroseptal and apical  akinesis  - medical therapy with ASA 81, atorva 80, hep gtt. Initially no beta blocker given due to active bronchospasm, no ACE/ARB due to hyperkalemia. Both have resolved this AM. STart lopressor 12.5mg  bid and lisinopril 2.5mg  daily.    - plan for cath Monday  - [facility] We are awaiting records. May need to consider BMS, though likely with latest generation DES would be ok if needed to hold DAPT after 3 months. Defer considration to interventional cardiology   2. COPD exacerbation - per primary team  3. Lung nodules - family reports [facility]. May need to consider BMS if results will require biopsy. I have placed order to request results.  - defer to primary team.   DNR status   I have reviewed the risks, indications, and alternatives to cardiac catheterization, possible angioplasty, and stenting with the patient  today. Risks include but are not limited to bleeding, infection, vascular injury, stroke, myocardial infection, arrhythmia, kidney injury, radiation-related injury in the case of prolonged fluoroscopy use, emergency cardiac surgery, and death. The patient understands the risks of serious complication is 1-2 in 1000 with diagnostic cardiac cath and 1-2% or less with angioplasty/stenting.    For questions or updates, please contact [facility] Please consult [email or web address] for contact info under Cardiology/STEMI.      Joanie CoddingtonSigned, Latavia Goga, MD  12/04/2017, 9:45 AM

## 2017-12-04 NOTE — H&P (View-Only) (Signed)
Progress Note  Patient Name: Jessica Sawyer Date of Encounter: 12/04/2017  Primary Cardiologist: new  Subjective   No complaints  Inpatient Medications    Scheduled Meds: . aspirin EC  81 mg Oral Daily  . atorvastatin  80 mg Oral q1800  . buPROPion  300 mg Oral Daily  . chlorhexidine  15 mL Mouth Rinse BID  . Chlorhexidine Gluconate Cloth  6 each Topical Q0600  . doxycycline  100 mg Oral Q12H  . guaiFENesin  600 mg Oral BID  . mouth rinse  15 mL Mouth Rinse q12n4p  . methylPREDNISolone (SOLU-MEDROL) injection  60 mg Intravenous Q12H  . mometasone-formoterol  2 puff Inhalation BID  . mupirocin ointment  1 application Nasal BID  . nicotine  21 mg Transdermal Daily  . sertraline  100 mg Oral Daily  . traZODone  25 mg Oral QHS   Continuous Infusions: . sodium chloride 75 mL/hr at 12/04/17 0636  . heparin 600 Units/hr (12/04/17 0417)   PRN Meds: acetaminophen, albuterol, ALPRAZolam, dextromethorphan-guaiFENesin, hydrALAZINE, ipratropium-albuterol, morphine injection, nitroGLYCERIN, ondansetron (ZOFRAN) IV, zolpidem   Vital Signs    Vitals:   12/04/17 0014 12/04/17 0608 12/04/17 0753 12/04/17 0827  BP: (!) 124/55 137/63 (!) 148/65   Pulse: 88 72    Resp: (!) 21 13    Temp: 99.8 F (37.7 C) 98 F (36.7 C) 98.1 F (36.7 C)   TempSrc: Oral Oral Oral   SpO2: 95% 96% 92% 98%  Weight:  140 lb 4.8 oz (63.6 kg)      Intake/Output Summary (Last 24 hours) at 12/04/2017 0945 Last data filed at 12/04/2017 0939 Gross per 24 hour  Intake 1282.89 ml  Output 1900 ml  Net -617.11 ml   Filed Weights   12/03/17 0500 12/03/17 1500 12/04/17 0608  Weight: 176 lb 11.2 oz (80.2 kg) 140 lb 3.4 oz (63.6 kg) 140 lb 4.8 oz (63.6 kg)    Telemetry    SR - Personally Reviewed  ECG    na  Physical Exam   GEN: No acute distress.   Neck: No JVD Cardiac: RRR, no murmurs, rubs, or gallops.  Respiratory: Clear to auscultation bilaterally. GI: Soft, nontender, non-distended  MS:  No edema; No deformity. Neuro:  Nonfocal  Psych: Normal affect   Labs    Chemistry Recent Labs  Lab 12/03/17 0334 12/04/17 0229  NA 137 141  K 5.6* 4.6  CL 102 110  CO2 25 24  GLUCOSE 174* 161*  BUN 17 23  CREATININE 0.97 0.87  CALCIUM 8.9 8.2*  PROT  --  5.4*  ALBUMIN  --  3.4*  AST  --  60*  ALT  --  43  ALKPHOS  --  52  BILITOT  --  0.5  GFRNONAA >60 >60  GFRAA >60 >60  ANIONGAP 10 7     Hematology Recent Labs  Lab 12/03/17 0334 12/04/17 0229  WBC 11.1* 17.5*  RBC 3.76* 3.26*  HGB 11.7* 10.2*  HCT 36.3 31.7*  MCV 96.5 97.2  MCH 31.1 31.3  MCHC 32.2 32.2  RDW 13.7 14.4  PLT 268 265    Cardiac Enzymes Recent Labs  Lab 12/02/17 2220 12/03/17 0334 12/03/17 1026  TROPONINI 2.10* 1.68* 1.38*   No results for input(s): TROPIPOC in the last 168 hours.   BNP Recent Labs  Lab 12/02/17 2220  BNP 680.9*     DDimer No results for input(s): DDIMER in the last 168 hours.   Radiology    Dg  Chest Port 1 View  Result Date: 12/03/2017 CLINICAL DATA:  Dyspnea.  Smoker. EXAM: PORTABLE CHEST 1 VIEW COMPARISON:  None. FINDINGS: Normal sized heart. Clear lungs. The lungs are hyperexpanded minimal biapical pleural and parenchymal scarring. Mild diffuse peribronchial thickening. Moderate right AC joint degenerative changes. IMPRESSION: No acute abnormality.  Changes of COPD and chronic bronchitis. Electronically Signed   By: Beckie SaltsSteven  Reid M.D.   On: 12/03/2017 13:01    Cardiac Studies    Patient Profile     Gaspar SkeetersKathy Williamsis a [age] y.o.femalewith a hx of COPD, tobacco abuse and depressionwho is being seen today for the evaluation of elevated troponin of 0.11at the request of Dr. Clyde LundborgNiu.    Assessment & Plan    1. NSTEMI - presented with SOB over the last few weeks, acutely worst day of admissoin. +chest tightness - [facility] on bipap. Trop at that time 0.11 - peak trop 2.10, trending down. EKG SR, no acute ischemic changes -  [facility] LVEF 50-55%,  Anteroseptal and apical  akinesis  - medical therapy with ASA 81, atorva 80, hep gtt. Initially no beta blocker given due to active bronchospasm, no ACE/ARB due to hyperkalemia. Both have resolved this AM. STart lopressor 12.5mg  bid and lisinopril 2.5mg  daily.    - plan for cath Monday  - family reports [facility]. We are awaiting records. May need to consider BMS, though likely with latest generation DES would be ok if needed to hold DAPT after 3 months. Defer considration to interventional cardiology   2. COPD exacerbation - per primary team  3. Lung nodules - family reports [facility]. May need to consider BMS if results will require biopsy. I have placed order to request results.  - defer to primary team.   DNR status   I have reviewed the risks, indications, and alternatives to cardiac catheterization, possible angioplasty, and stenting with the patient  today. Risks include but are not limited to bleeding, infection, vascular injury, stroke, myocardial infection, arrhythmia, kidney injury, radiation-related injury in the case of prolonged fluoroscopy use, emergency cardiac surgery, and death. The patient understands the risks of serious complication is 1-2 in 1000 with diagnostic cardiac cath and 1-2% or less with angioplasty/stenting.    For questions or updates, please contact [facility] Please consult [email or web address] for contact info under Cardiology/STEMI.      Joanie CoddingtonSigned, Dillan Lunden, MD  12/04/2017, 9:45 AM

## 2017-12-05 ENCOUNTER — Inpatient Hospital Stay (HOSPITAL_COMMUNITY): Payer: Medicaid Other

## 2017-12-05 ENCOUNTER — Encounter (HOSPITAL_COMMUNITY): Payer: Self-pay | Admitting: Cardiovascular Disease

## 2017-12-05 ENCOUNTER — Inpatient Hospital Stay (HOSPITAL_COMMUNITY)
Admission: AD | Disposition: A | Discharge status: Home / Self Care | Payer: Self-pay | Source: Other Acute Inpatient Hospital | Attending: Internal Medicine

## 2017-12-05 DIAGNOSIS — R748 Abnormal levels of other serum enzymes: Secondary | ICD-10-CM

## 2017-12-05 DIAGNOSIS — R7989 Other specified abnormal findings of blood chemistry: Secondary | ICD-10-CM

## 2017-12-05 DIAGNOSIS — J441 Chronic obstructive pulmonary disease with (acute) exacerbation: Secondary | ICD-10-CM

## 2017-12-05 DIAGNOSIS — R778 Other specified abnormalities of plasma proteins: Secondary | ICD-10-CM

## 2017-12-05 HISTORY — PX: LEFT HEART CATH AND CORONARY ANGIOGRAPHY: CATH118249

## 2017-12-05 LAB — COMPREHENSIVE METABOLIC PANEL
ALT: 374 U/L — ABNORMAL HIGH (ref 0–44)
AST: 399 U/L — ABNORMAL HIGH (ref 15–41)
Albumin: 3.7 g/dL (ref 3.5–5.0)
Alkaline Phosphatase: 60 U/L (ref 38–126)
Anion gap: 10 (ref 5–15)
BUN: 22 mg/dL (ref 8–23)
CHLORIDE: 108 mmol/L (ref 98–111)
CO2: 22 mmol/L (ref 22–32)
Calcium: 8.5 mg/dL — ABNORMAL LOW (ref 8.9–10.3)
Creatinine, Ser: 0.84 mg/dL (ref 0.44–1.00)
GFR calc non Af Amer: >60 mL/min (ref >60)
Glucose, Bld: 129 mg/dL — ABNORMAL HIGH (ref 70–99)
Potassium: 4.6 mmol/L (ref 3.5–5.1)
SODIUM: 140 mmol/L (ref 135–145)
Total Bilirubin: 0.6 mg/dL (ref 0.3–1.2)
Total Protein: 5.9 g/dL — ABNORMAL LOW (ref 6.5–8.1)

## 2017-12-05 LAB — T4, FREE: Free T4: 0.94 ng/dL (ref 0.82–1.77)

## 2017-12-05 LAB — CBC
HCT: 35.1 % — ABNORMAL LOW (ref 36.0–46.0)
Hemoglobin: 10.9 g/dL — ABNORMAL LOW (ref 12.0–15.0)
MCH: 31.5 pg (ref 26.0–34.0)
MCHC: 31.1 g/dL (ref 30.0–36.0)
MCV: 101.4 fL — ABNORMAL HIGH (ref 78.0–100.0)
Platelets: 266 10*3/uL (ref 150–400)
RBC: 3.46 MIL/uL — AB (ref 3.87–5.11)
RDW: 14.6 % (ref 11.5–15.5)
WBC: 14.6 10*3/uL — ABNORMAL HIGH (ref 4.0–10.5)

## 2017-12-05 LAB — PROTIME-INR
INR: 1.02
PROTHROMBIN TIME: 13.3 s (ref 11.4–15.2)

## 2017-12-05 LAB — HEPARIN LEVEL (UNFRACTIONATED): Heparin Unfractionated: 0.34 IU/mL (ref 0.30–0.70)

## 2017-12-05 SURGERY — LEFT HEART CATH AND CORONARY ANGIOGRAPHY
Anesthesia: LOCAL

## 2017-12-05 MED ORDER — SODIUM CHLORIDE 0.9 % IV SOLN: INTRAVENOUS | Status: AC

## 2017-12-05 MED ORDER — HEPARIN SODIUM (PORCINE) 1000 UNIT/ML IJ SOLN
INTRAMUSCULAR | Status: DC | PRN
  Administered 2017-12-05: 3000 [IU] via INTRAVENOUS

## 2017-12-05 MED ORDER — MIDAZOLAM HCL 2 MG/2ML IJ SOLN
INTRAMUSCULAR | Status: DC | PRN
  Administered 2017-12-05: 1 mg via INTRAVENOUS

## 2017-12-05 MED ORDER — LIDOCAINE HCL (PF) 1 % IJ SOLN
INTRAMUSCULAR | Status: AC
  Filled 2017-12-05: qty 30

## 2017-12-05 MED ORDER — HEPARIN (PORCINE) IN NACL 1000-0.9 UT/500ML-% IV SOLN
INTRAVENOUS | Status: AC
  Filled 2017-12-05: qty 500

## 2017-12-05 MED ORDER — VERAPAMIL HCL 2.5 MG/ML IV SOLN
INTRAVENOUS | Status: AC
  Filled 2017-12-05: qty 2

## 2017-12-05 MED ORDER — DOXYCYCLINE HYCLATE 100 MG PO TABS: 100.0000 mg | ORAL_TABLET | Freq: Two times a day (BID) | ORAL | 0 refills | Status: AC

## 2017-12-05 MED ORDER — HEPARIN (PORCINE) IN NACL 2-0.9 UNITS/ML
INTRAMUSCULAR | Status: AC | PRN
  Administered 2017-12-05 (×2): 500 mL

## 2017-12-05 MED ORDER — SODIUM CHLORIDE 0.9% FLUSH: 3.0000 mL | INTRAVENOUS | Status: DC | PRN

## 2017-12-05 MED ORDER — FENTANYL CITRATE (PF) 100 MCG/2ML IJ SOLN
INTRAMUSCULAR | Status: DC | PRN
  Administered 2017-12-05: 25 ug via INTRAVENOUS

## 2017-12-05 MED ORDER — VERAPAMIL HCL 2.5 MG/ML IV SOLN
INTRAVENOUS | Status: DC | PRN
  Administered 2017-12-05: 10 mL via INTRA_ARTERIAL

## 2017-12-05 MED ORDER — LIDOCAINE HCL (PF) 1 % IJ SOLN
INTRAMUSCULAR | Status: DC | PRN
  Administered 2017-12-05: 2 mL

## 2017-12-05 MED ORDER — FENTANYL CITRATE (PF) 100 MCG/2ML IJ SOLN
INTRAMUSCULAR | Status: AC
  Filled 2017-12-05: qty 2

## 2017-12-05 MED ORDER — SODIUM CHLORIDE 0.9 % IV SOLN: 250.0000 mL | INTRAVENOUS | Status: DC | PRN

## 2017-12-05 MED ORDER — HEPARIN SODIUM (PORCINE) 1000 UNIT/ML IJ SOLN
INTRAMUSCULAR | Status: AC
  Filled 2017-12-05: qty 1

## 2017-12-05 MED ORDER — TRAZODONE HCL 50 MG PO TABS: 25.0000 mg | ORAL_TABLET | Freq: Every evening | ORAL | 0 refills | Status: AC | PRN

## 2017-12-05 MED ORDER — PREDNISONE 10 MG PO TABS: ORAL_TABLET | ORAL | 0 refills | Status: AC

## 2017-12-05 MED ORDER — MIDAZOLAM HCL 2 MG/2ML IJ SOLN
INTRAMUSCULAR | Status: AC
  Filled 2017-12-05: qty 2

## 2017-12-05 MED ORDER — GUAIFENESIN ER 600 MG PO TB12: 600.0000 mg | ORAL_TABLET | Freq: Two times a day (BID) | ORAL | 0 refills | Status: AC

## 2017-12-05 MED ORDER — SODIUM CHLORIDE 0.9% FLUSH: 3.0000 mL | Freq: Two times a day (BID) | INTRAVENOUS | Status: DC

## 2017-12-05 MED ORDER — IOHEXOL 350 MG/ML SOLN
INTRAVENOUS | Status: DC | PRN
  Administered 2017-12-05: 55 mL

## 2017-12-05 SURGICAL SUPPLY — 10 items

## 2017-12-05 NOTE — Progress Notes (Signed)
Patient ambulated with RN in hall about 1400ft. Maintained O2 sats. 92-96% on RA. Did have to take multiple rest breaks. Will continue to monitor.

## 2017-12-05 NOTE — Discharge Summary (Addendum)
Discharge Summary  Jessica CandleKathy Amend NWG:[id number] DOB: 05/30/1951  PCP: Anselmo PicklerAchreja, Manjeet Kaur, MD  Admit date: 12/02/2017 Discharge date: 12/05/2017  Time spent: 45mins, more than 50% time spent on coordination of care.  Recommendations for Outpatient Follow-up:  1. F/u with PMD within a week  for hospital discharge follow up, repeat cbc/bmp at follow up.  pmd to follow up on hepatitis panel. pmd to monitor blood pressure and lipid panel. 2. F/u with pulmonology for lung nodules (patient reports she is schedule to see a pulmonologist tomorrow)  Discharge Diagnoses:  Active Hospital Problems   Diagnosis Date Noted  . NSTEMI (non-ST elevated myocardial infarction) (HCC) 12/02/2017  . Elevated troponin   . COPD with acute exacerbation (HCC) 12/02/2017  . Tobacco abuse 12/02/2017  . Acute on chronic respiratory failure with hypoxia (HCC) 12/02/2017  . Anxiety   . Depression   . Hypertension     Resolved Hospital Problems  No resolved problems to display.    Discharge Condition: stable  Diet recommendation: regular diet  Filed Weights   12/03/17 1500 12/04/17 0608 12/05/17 0529  Weight: 63.6 kg (140 lb 3.4 oz) 63.6 kg (140 lb 4.8 oz) 64.4 kg (141 lb 14.4 oz)    History of present illness: ( per admitting MD Dr Lorretta HarpNiu Xilin)  PCP: Anselmo PicklerAchreja, Manjeet Kaur, MD   Patient coming from:  The patient is coming from home.  At baseline, pt is independent for most of ADL. SNF  Assistant living facility   Retirement center.       Chief Complaint: SOB  HPI: Jessica Sawyer is a 67 y.o. female with medical history significant of COPD, anxiety disorder, tobacco abuse and depression with no previous CAD history, who presented with SOB.   Pt is transferred from [facility] due to non-STEMI.  Patient has been having shortness of breath in the past several weeks, which has been progressively getting worse. Patient denies any chest pain. She has dry cough, but no fever or chills.  She has  diaphoresis and weakness in both legs.  [facility] today and was found to have NSTEMI with trop 0.11-->1.09. Pt is tachypneic and has oxygen desaturation to 88% on room air. She was put on BiPAP which greatly improved her oxygenation and rated breathing. An ABG was checked while on BiPAP noting a pH of 7.34 and a pCO2 of 44 with PO2 of 130. Patient's chest x-ray noted no evidence of infiltrate, but did note emphysematous changes. A procalcitonin level was normal. Lactic acid level at 1.6. Other labs of note was white count of 17 and cre 0.90. Patient was given dose of Solu-Medrol nebulizer treatments and was able to be weaned off the BiPAP on to 2 L nasal cannula oxygen. Reported EKG noted no signs of ST changes. Cardiology at [facility] (Dr.Crenshaw) was consulted, who agreed patient should be assessed further and accepted the patient for [facility]. Pt was started with IV heparin.  When I saw pt on the floor, pt patient has shortness of breath and is in respiratory distress. She can barely speaking full sentence. She has dry cough, no chest pain, fever or chills.  She denies nausea, vomiting, diarrhea, abdominal pain, symptoms of UTI or unilateral weakness. She states that she is absolutely sure that she wants to be DNR.  2d echo done in [facility] 6[facility]: 1. This was a technically difficult study with suboptimal views. 2. Overall left ventricular systolic function is low-normal with, an  EF between 50 - 55 %. 3. The diastolic filling pattern indicates impaired relaxation. 4. Basal anteroseptal LV wall motion is akinetic. 5. Mid anteroseptal LV wall motion is akinetic. 6. Apical septum LV wall motion is hypokinetic. 7. Unable to estimate RVSP due to inadequate TR jet spectral doppler profile. 8. There is no pericardial effusion.    Hospital Course:  Principal Problem:   NSTEMI (non-ST elevated myocardial infarction) (HCC) Active Problems:   COPD with  acute exacerbation (HCC)   Anxiety   Depression   Hypertension   Tobacco abuse   Acute on chronic respiratory failure with hypoxia (HCC)   Elevated troponin   NSTEMI: Troponin peaked at 2.1 She received heparin drip,asa, statin Cardiac cath with clean coronary, lvef wnl,  Elevated troponin though to be due to demand ischemia in the setting of respiratory distress    Acute hypoxic respiratory failure/copd exacerbation ( she is on prednisone 10mg  daily at home  for copd for the last 6 months) -cxr with copd changes and chronic bronchitis changes -no fever, now on room air at rest -mrsa screening +, respiratory viral panel negative -she received steroids, nebs,  oral doxycycline and mucinex.  -she is improving, on room air now. She is discharged on doxycycline, prednisone taper, she has nebulizer at home.   Mild hyperkalemia: k on presentation was 5.6, k normalized after continue hydration, and nebs   Leukocytosis Likely from the steroids Wbc improved, patient is nontoxic appearing  Mild elevated transaminitis Patient denies abdominal pain  hepatitis panel in process ab US unremarkable Avoid statin for now pdm follow up.  Normocytic anemia: Mild hgb 10-11 monitor   prior to admission, patient underwent a PET scan done at [facility] on 6/27. Apparently patient had a screening CT which noted bilateral pulmonary nodules. Patient's PET scan noted 3 pulmonary nodules which were hypermetabolic the most suspicious being the right apex and considered for tissue sampling versus follow-up chest CT in 3 months. No evidence of thoracic nodule or hypermetabolic extra thoracic disease. Upon discharge, patient will need to follow up with [facility] .  She is scheduled to see pulmonology tomorrow.  Tobacco use: Smoking cessation education provided at length>80mins  Code Status: DNR  Family Communication: patient   Disposition Plan:  home   Consultants:  cardiology  Procedures:  cardiac cath on 7/1.  Antibiotics:  Doxycycline from 6/29   Discharge Exam: BP 126/67   Pulse 72   Temp 98.1 F (36.7 C) (Oral)   Resp 20   Ht 5\' 7"  (1.702 m)   Wt 64.4 kg (141 lb 14.4 oz)   SpO2 98%   BMI 22.22 kg/m   General: NAD, very pleasant Cardiovascular: RRR Respiratory: improved aeration, no wheezing today  Discharge Instructions You were cared for by a hospitalist during your hospital stay. If you have any questions about your discharge medications or the care you received while you were in the hospital after you are discharged, you can call the unit and asked to speak with the hospitalist on call if the hospitalist that took care of you is not available. Once you are discharged, your primary care physician will handle any further medical issues. Please note that NO REFILLS for any discharge medications will be authorized once you are discharged, as it is imperative that you return to your primary care physician (or establish a relationship with a primary care physician if you do not have one) for your aftercare needs so that they can  reassess your need for medications and monitor your lab values.   Allergies as of 12/05/2017   No Known Allergies     Medication List    STOP taking these medications   meclizine 25 MG tablet Commonly known as:  ANTIVERT     TAKE these medications   ALPRAZolam 0.25 MG tablet Commonly known as:  XANAX Take 0.25 mg by mouth daily as needed.   buPROPion 300 MG 24 hr tablet Commonly known as:  WELLBUTRIN XL Take 300 mg by mouth every morning.   doxycycline 100 MG tablet Commonly known as:  VIBRA-TABS Take 1 tablet (100 mg total) by mouth every 12 (twelve) hours for 5 days.   guaiFENesin 600 MG 12 hr tablet Commonly known as:  MUCINEX Take 1 tablet (600 mg total) by mouth 2 (two) times daily.   predniSONE 10 MG tablet Commonly known as:  DELTASONE Take 10 mg by mouth  daily with breakfast. What changed:  Another medication with the same name was added. Make sure you understand how and when to take each.   predniSONE 10 MG tablet Commonly known as:  DELTASONE Take 6 Pills PO on day one then, 5 Pills PO on day two, 4 Pills PO on day three, 3Pills PO on day four, 2 Pills PO on day five, 1 Pills PO on day six,  Then continue your home dose prednisone. What changed:  You were already taking a medication with the same name, and this prescription was added. Make sure you understand how and when to take each.   sertraline 100 MG tablet Commonly known as:  ZOLOFT Take 100 mg by mouth daily.   SYMBICORT 80-4.5 MCG/ACT inhaler Generic drug:  budesonide-formoterol Inhale 2 puffs into the lungs 2 (two) times daily.   traZODone 50 MG tablet Commonly known as:  DESYREL Take 0.5 tablets (25 mg total) by mouth at bedtime as needed for sleep. What changed:    when to take this  reasons to take this      No Known Allergies Follow-up Information    Achreja, Youlanda Mighty, MD Follow up in 1 week(s).   Specialty:  Family Medicine Why:  hospital discharge follow up, repeat cbc/bmp/liver function at follow up. please check your blood pressure at home and bring in record for your doctor to reveiw. Contact information: [address] [place] [address] [phone]            The results of significant diagnostics from this hospitalization (including imaging, microbiology, ancillary and laboratory) are listed below for reference.    Significant Diagnostic Studies: Dg Chest Port 1 View  Result Date: 12/03/2017 CLINICAL DATA:  Dyspnea.  Smoker. EXAM: PORTABLE CHEST 1 VIEW COMPARISON:  None. FINDINGS: Normal sized heart. Clear lungs. The lungs are hyperexpanded minimal biapical pleural and parenchymal scarring. Mild diffuse peribronchial thickening. Moderate right AC joint degenerative changes. IMPRESSION: No acute abnormality.  Changes of COPD and chronic  bronchitis. Electronically Signed   By: Beckie Salts M.D.   On: 12/03/2017 13:01    Microbiology: Recent Results (from the past 240 hour(s))  Respiratory Panel by PCR     Status: None   Collection Time: 12/02/17 10:51 PM  Result Value Ref Range Status   Adenovirus NOT DETECTED NOT DETECTED Final   Coronavirus 229E NOT DETECTED NOT DETECTED Final   Coronavirus HKU1 NOT DETECTED NOT DETECTED Final   Coronavirus NL63 NOT DETECTED NOT DETECTED Final   Coronavirus OC43 NOT DETECTED NOT DETECTED Final  Metapneumovirus NOT DETECTED NOT DETECTED Final   Rhinovirus / Enterovirus NOT DETECTED NOT DETECTED Final   Influenza A NOT DETECTED NOT DETECTED Final   Influenza B NOT DETECTED NOT DETECTED Final   Parainfluenza Virus 1 NOT DETECTED NOT DETECTED Final   Parainfluenza Virus 2 NOT DETECTED NOT DETECTED Final   Parainfluenza Virus 3 NOT DETECTED NOT DETECTED Final   Parainfluenza Virus 4 NOT DETECTED NOT DETECTED Final   Respiratory Syncytial Virus NOT DETECTED NOT DETECTED Final   Bordetella pertussis NOT DETECTED NOT DETECTED Final   Chlamydophila pneumoniae NOT DETECTED NOT DETECTED Final   Mycoplasma pneumoniae NOT DETECTED NOT DETECTED Final  MRSA PCR Screening     Status: Abnormal   Collection Time: 12/02/17 10:51 PM  Result Value Ref Range Status   MRSA by PCR POSITIVE (A) NEGATIVE Final    Comment:        The GeneXpert MRSA Assay (FDA approved for NASAL specimens only), is one component of a comprehensive MRSA colonization surveillance program. It is not intended to diagnose MRSA infection nor to guide or monitor treatment for MRSA infections. RESULT CALLED TO, READ BACK BY AND VERIFIED WITH: D DUVALL RN 12/03/17 0258 JDW   Culture, blood (Routine X 2) w Reflex to ID Panel     Status: None (Preliminary result)   Collection Time: 12/02/17 10:57 PM  Result Value Ref Range Status   Specimen Description BLOOD RIGHT ANTECUBITAL  Final   Special Requests   Final    BOTTLES  DRAWN AEROBIC AND ANAEROBIC Blood Culture adequate volume   Culture   Final    NO GROWTH 2 DAYS Performed at [facility], [address], [place], [place] [address]    Report Status PENDING  Incomplete  Culture, blood (Routine X 2) w Reflex to ID Panel     Status: None (Preliminary result)   Collection Time: 12/02/17 11:03 PM  Result Value Ref Range Status   Specimen Description BLOOD RIGHT WRIST  Final   Special Requests   Final    BOTTLES DRAWN AEROBIC ONLY Blood Culture adequate volume   Culture   Final    NO GROWTH 2 DAYS Performed at [facility], [address], [place], [place] [address]    Report Status PENDING  Incomplete     Labs: Basic Metabolic Panel: Recent Labs  Lab 12/03/17 0334 12/04/17 0229 12/05/17 0510  NA 137 141 140  K 5.6* 4.6 4.6  CL 102 110 108  CO2 25 24 22   GLUCOSE 174* 161* 129*  BUN 17 23 22   CREATININE 0.97 0.87 0.84  CALCIUM 8.9 8.2* 8.5*   Liver Function Tests: Recent Labs  Lab 12/04/17 0229 12/05/17 0510  AST 60* 399*  ALT 43 374*  ALKPHOS 52 60  BILITOT 0.5 0.6  PROT 5.4* 5.9*  ALBUMIN 3.4* 3.7   No results for input(s): LIPASE, AMYLASE in the last 168 hours. No results for input(s): AMMONIA in the last 168 hours. CBC: Recent Labs  Lab 12/03/17 0334 12/04/17 0229 12/05/17 0510  WBC 11.1* 17.5* 14.6*  HGB 11.7* 10.2* 10.9*  HCT 36.3 31.7* 35.1*  MCV 96.5 97.2 101.4*  PLT 268 265 266   Cardiac Enzymes: Recent Labs  Lab 12/02/17 2220 12/03/17 0334 12/03/17 1026  TROPONINI 2.10* 1.68* 1.38*   BNP: BNP (last 3 results) Recent Labs    12/02/17 2220  BNP 680.9*    ProBNP (last 3 results) No results for input(s): PROBNP in the last  8760 hours.  CBG: No results for input(s): GLUCAP in the last 168 hours.     Signed:  Albertine Grates MD, PhD  [facility] 12/05/2017, 1:02 PM

## 2017-12-05 NOTE — Progress Notes (Signed)
Patient and family received discharge information and acknowledged understanding of it. Patient received printed prescription. Patient received radial site care education and mychart access. Patient IVs were removed.

## 2017-12-05 NOTE — Progress Notes (Signed)
Progress Note  Patient Name: Jessica CandleKathy Zemaitis Date of Encounter: 12/05/2017  Primary Cardiologist: Wyline MoodBranch  Subjective   No chest pain today.  Breathing improved.  Inpatient Medications    Scheduled Meds: . aspirin EC  81 mg Oral Daily  . atorvastatin  80 mg Oral q1800  . buPROPion  300 mg Oral Daily  . chlorhexidine  15 mL Mouth Rinse BID  . Chlorhexidine Gluconate Cloth  6 each Topical Q0600  . doxycycline  100 mg Oral Q12H  . guaiFENesin  600 mg Oral BID  . lisinopril  2.5 mg Oral Daily  . mouth rinse  15 mL Mouth Rinse q12n4p  . methylPREDNISolone (SOLU-MEDROL) injection  60 mg Intravenous Q12H  . metoprolol tartrate  12.5 mg Oral BID  . mometasone-formoterol  2 puff Inhalation BID  . mupirocin ointment  1 application Nasal BID  . nicotine  21 mg Transdermal Daily  . sertraline  100 mg Oral Daily  . sodium chloride flush  3 mL Intravenous Q12H  . traZODone  25 mg Oral QHS   Continuous Infusions: . sodium chloride 191 mL/hr at 12/05/17 0549  . sodium chloride 75 mL/hr at 12/05/17 1056  . sodium chloride     PRN Meds: sodium chloride, acetaminophen, albuterol, ALPRAZolam, dextromethorphan-guaiFENesin, hydrALAZINE, ipratropium-albuterol, morphine injection, nitroGLYCERIN, ondansetron (ZOFRAN) IV, sodium chloride flush, zolpidem   Vital Signs    Vitals:   12/05/17 0938 12/05/17 0943 12/05/17 1056 12/05/17 1057  BP: 136/68  126/67 126/67  Pulse: 68 (!) 0 72 72  Resp: 15 (!) 0 20   Temp:      TempSrc:      SpO2: 99% (!) 0% 98%   Weight:      Height:        Intake/Output Summary (Last 24 hours) at 12/05/2017 1556 Last data filed at 12/05/2017 1200 Gross per 24 hour  Intake 2160.67 ml  Output 1400 ml  Net 760.67 ml   Filed Weights   12/03/17 1500 12/04/17 0608 12/05/17 0529  Weight: 140 lb 3.4 oz (63.6 kg) 140 lb 4.8 oz (63.6 kg) 141 lb 14.4 oz (64.4 kg)    Telemetry    Normal sinus rhythm- Personally Reviewed  ECG    Most recently performed on  12/03/2017, revealed precordial T wave inversion new compared with the admitting tracing.- Personally Reviewed  Physical Exam   GEN: No acute distress.   Neck: No JVD Cardiac: RRR, no murmurs, rubs, or gallops.  Respiratory: Clear to auscultation bilaterally. GI: Soft, nontender, non-distended  MS: No edema; No deformity. Neuro:  Nonfocal  Psych: Normal affect   Labs    Chemistry Recent Labs  Lab 12/03/17 0334 12/04/17 0229 12/05/17 0510  NA 137 141 140  K 5.6* 4.6 4.6  CL 102 110 108  CO2 25 24 22   GLUCOSE 174* 161* 129*  BUN 17 23 22   CREATININE 0.97 0.87 0.84  CALCIUM 8.9 8.2* 8.5*  PROT  --  5.4* 5.9*  ALBUMIN  --  3.4* 3.7  AST  --  60* 399*  ALT  --  43 374*  ALKPHOS  --  52 60  BILITOT  --  0.5 0.6  GFRNONAA >60 >60 >60  GFRAA >60 >60 >60  ANIONGAP 10 7 10      Hematology Recent Labs  Lab 12/03/17 0334 12/04/17 0229 12/05/17 0510  WBC 11.1* 17.5* 14.6*  RBC 3.76* 3.26* 3.46*  HGB 11.7* 10.2* 10.9*  HCT 36.3 31.7* 35.1*  MCV 96.5 97.2 101.4*  MCH 31.1 31.3 31.5  MCHC 32.2 32.2 31.1  RDW 13.7 14.4 14.6  PLT 268 265 266    Cardiac Enzymes Recent Labs  Lab 12/02/17 2220 12/03/17 0334 12/03/17 1026  TROPONINI 2.10* 1.68* 1.38*   No results for input(s): TROPIPOC in the last 168 hours.   BNP Recent Labs  Lab 12/02/17 2220  BNP 680.9*     DDimer No results for input(s): DDIMER in the last 168 hours.   Radiology    US Abdomen Limited  Result Date: 12/05/2017 CLINICAL DATA:  Elevated liver function tests. EXAM: ULTRASOUND ABDOMEN LIMITED RIGHT UPPER QUADRANT COMPARISON:  None. FINDINGS: Gallbladder: No gallstones or wall thickening visualized. No sonographic Murphy sign noted by sonographer. Common bile duct: Diameter: 2 mm, normal. Liver: No focal lesion identified. Within normal limits in parenchymal echogenicity. Portal vein is patent on color Doppler imaging with normal direction of blood flow towards the liver. IMPRESSION: Normal exam.   Specifically, the liver parenchyma appears normal. Electronically Signed   By: Francene Boyers M.D.   On: 12/05/2017 14:31    Cardiac Studies   Cardiac catheterization 12/05/2017: Conclusion      The left ventricular systolic function is normal.  LV end diastolic pressure is normal.  The left ventricular ejection fraction is 55-65% by visual estimate.  There is no mitral valve regurgitation.   1. No evidence of coronary artery disease 2. Normal LV systolic function   Recommendations: No further cardiac workup. Her elevated troponin is likely due to demand ischemia in the setting of COPD exacerbation/acute respiratory failure.        Patient Profile     67 y.o. female with a hx of COPD, tobacco abuse and depression who is being seen today for the evaluation of elevated troponin of 0.11 at the request of Dr. Clyde Lundborg.  Coronary angiography performed 12/05/2017 was unremarkable for evidence of obstructive coronary disease.   Assessment & Plan    1.  Elevated troponin in the setting of respiratory distress and now demonstrated widely patent coronary arteries suggest demand ischemia.  In absence of regional wall motion abnormality, stress cardiomyopathy cannot be diagnosed.   [facility] will sign off.   Medication Recommendations:  No specific cardiac therapy is indicated. Other recommendations (labs, testing, etc): Risk factor modification to avoid future ischemic/vascular complications: Smoking cessation, will less than 100, aerobic exercise, and hemoglobin A1c less than 7. Follow up as an outpatient: Does not need cardiology follow-up.  For questions or updates, please contact [facility] Please consult [email or web address] for contact info under Cardiology/STEMI.      Signed, Lesleigh Noe, MD  12/05/2017, 3:56 PM

## 2017-12-05 NOTE — Interval H&P Note (Signed)
History and Physical Interval Note:  12/05/2017 9:05 AM  Gardner CandleKathy Lawniczak  has presented today for cardiac cath with the diagnosis of NSTEMI  The various methods of treatment have been discussed with the patient and family. After consideration of risks, benefits and other options for treatment, the patient has consented to  Procedure(s): LEFT HEART CATH AND CORONARY ANGIOGRAPHY (N/A) as a surgical intervention .  The patient's history has been reviewed, patient examined, no change in status, stable for surgery.  I have reviewed the patient's chart and labs.  Questions were answered to the patient's satisfaction.    Cath Lab Visit (complete for each Cath Lab visit)  Clinical Evaluation Leading to the Procedure:   ACS: Yes.    Non-ACS:    Anginal Classification: CCS III  Anti-ischemic medical therapy: No Therapy  Non-Invasive Test Results: No non-invasive testing performed  Prior CABG: No previous CABG         Verne Carrowhristopher McAlhany

## 2017-12-06 LAB — HEPATITIS PANEL, ACUTE
HEP A IGM: NEGATIVE
HEP B S AG: NEGATIVE
Hep B C IgM: NEGATIVE

## 2017-12-06 MED FILL — Heparin Sod (Porcine)-NaCl IV Soln 1000 Unit/500ML-0.9%: INTRAVENOUS | Qty: 1000 | Status: AC

## 2017-12-08 LAB — CULTURE, BLOOD (ROUTINE X 2)
CULTURE: NO GROWTH
CULTURE: NO GROWTH
SPECIAL REQUESTS: ADEQUATE
Special Requests: ADEQUATE

## 2018-04-07 DEATH — deceased

## 2020-01-17 IMAGING — US US ABDOMEN LIMITED
1 series · 14 of 25 positions shown · non-contrast
Comparison: None.

CLINICAL DATA: Elevated liver function tests.

EXAM:
ULTRASOUND ABDOMEN LIMITED RIGHT UPPER QUADRANT

[Series 1: us abdomen limited · 0.19mm/px · 14 of 41 slices shown]
[im 1/41]
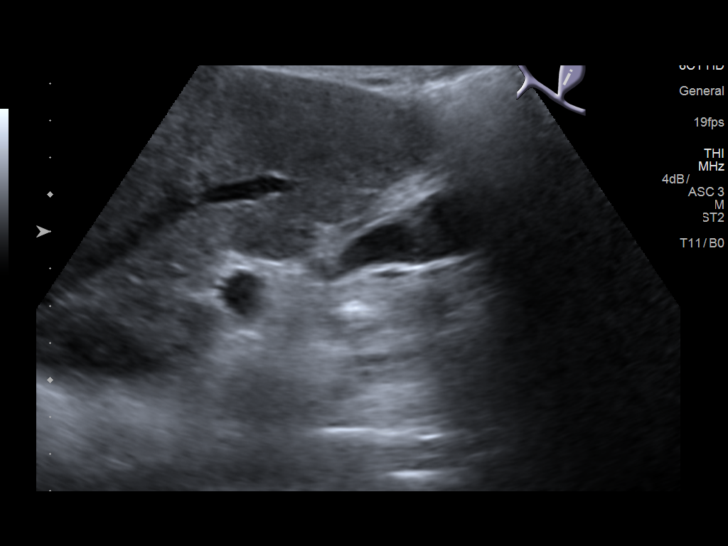
[im 4/41]
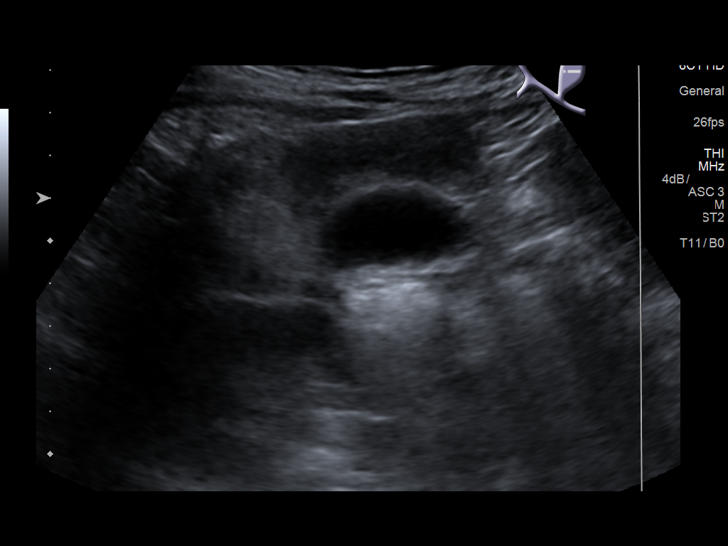
[im 7/41]
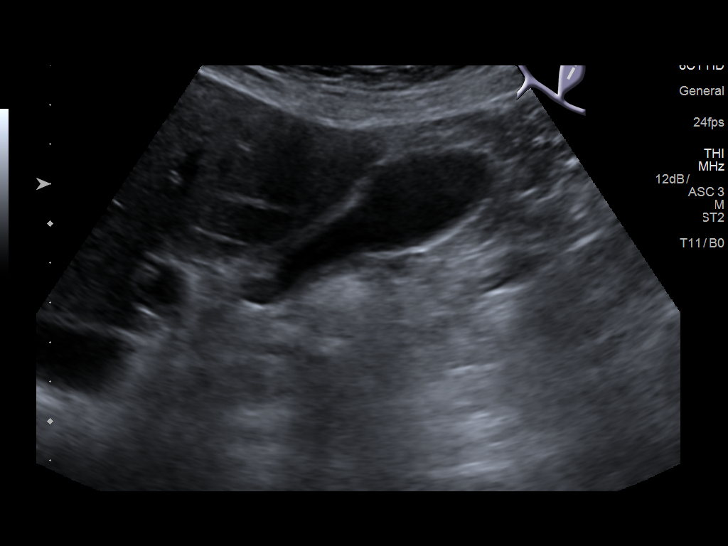
[im 11/41]
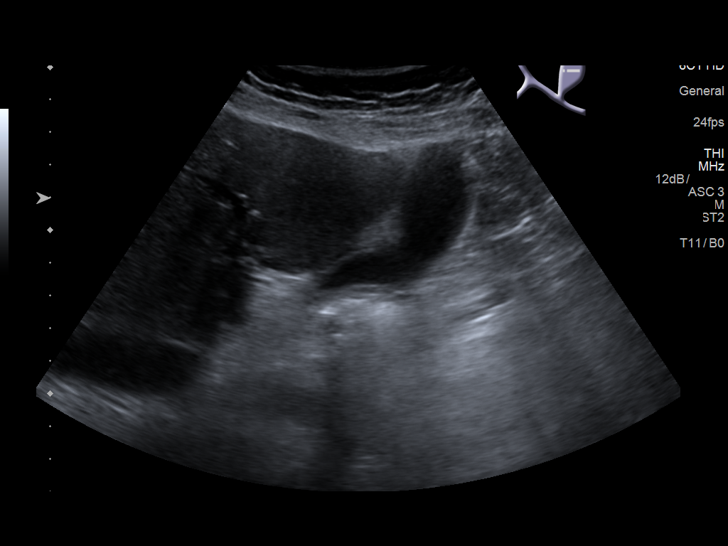
[im 14/41]
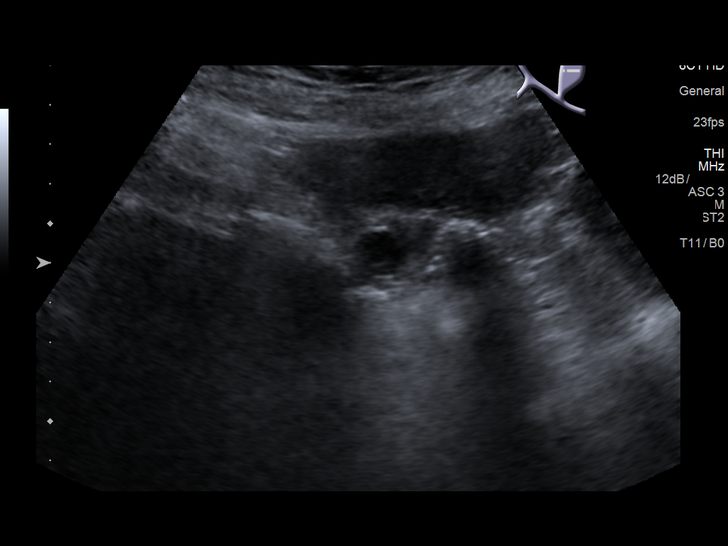
[im 16/41]
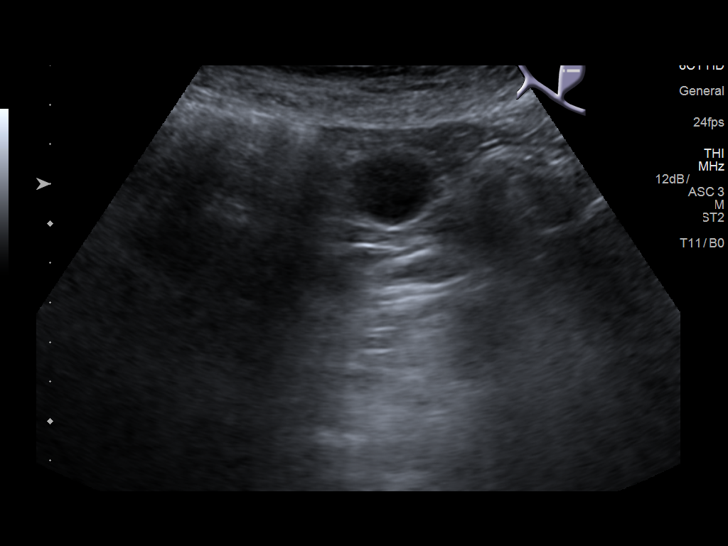
[im 19/41]
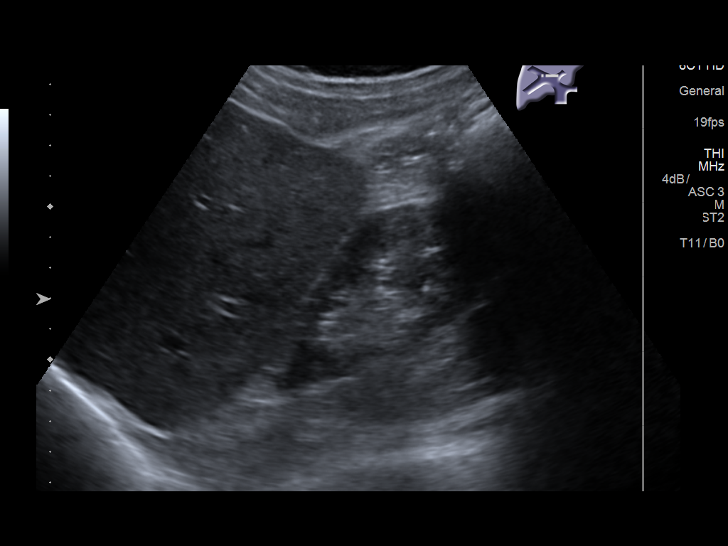
[im 22/41]
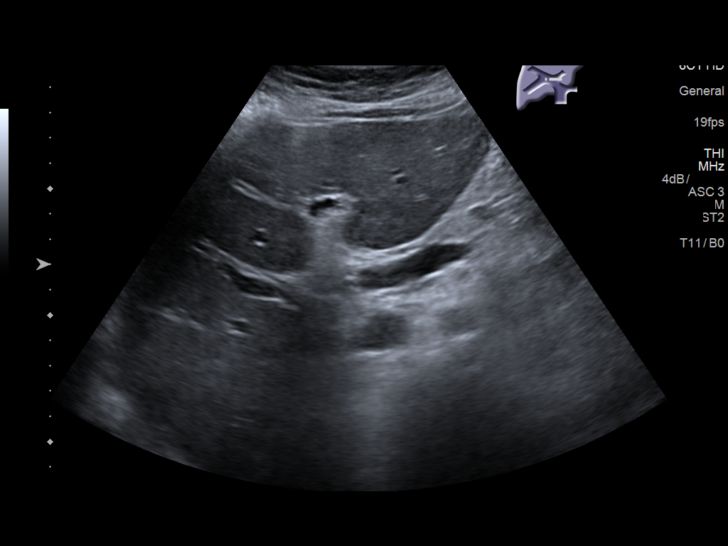
[im 26/41]
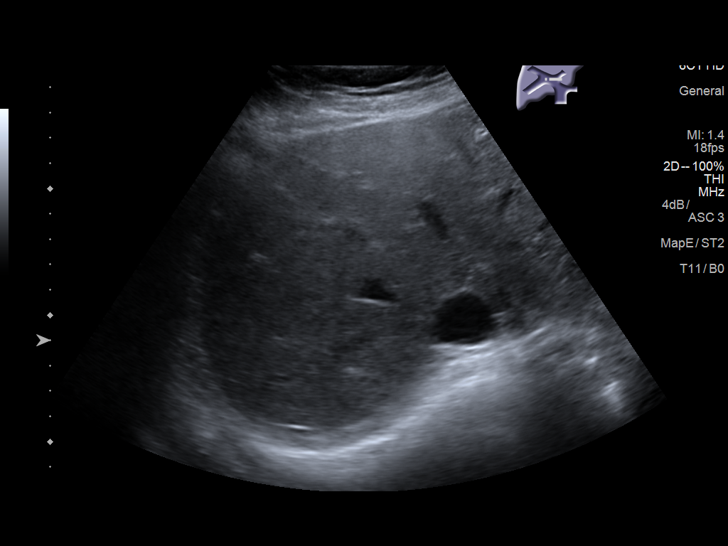
[im 27/41]
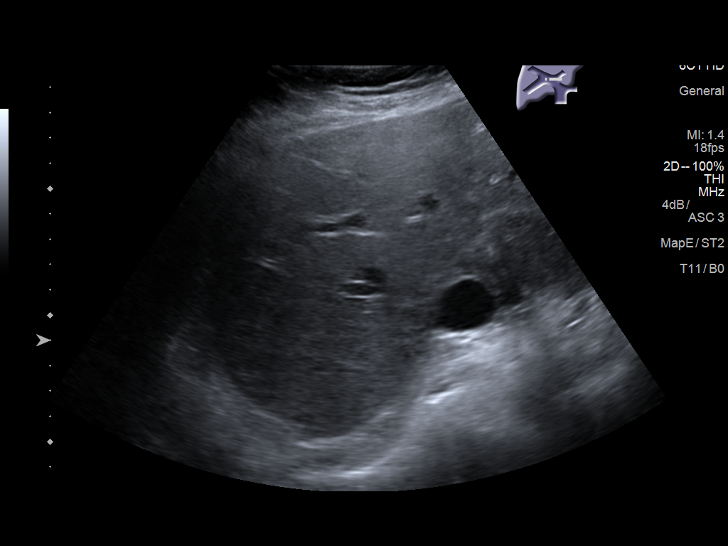
[im 31/41]
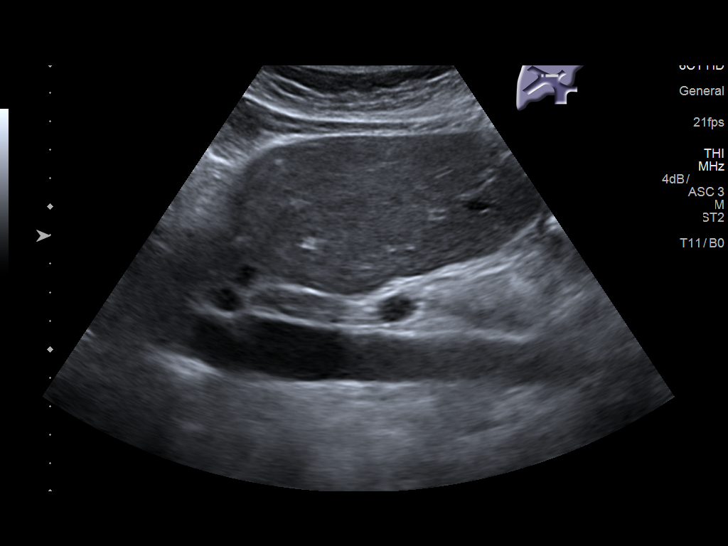
[im 34/41]
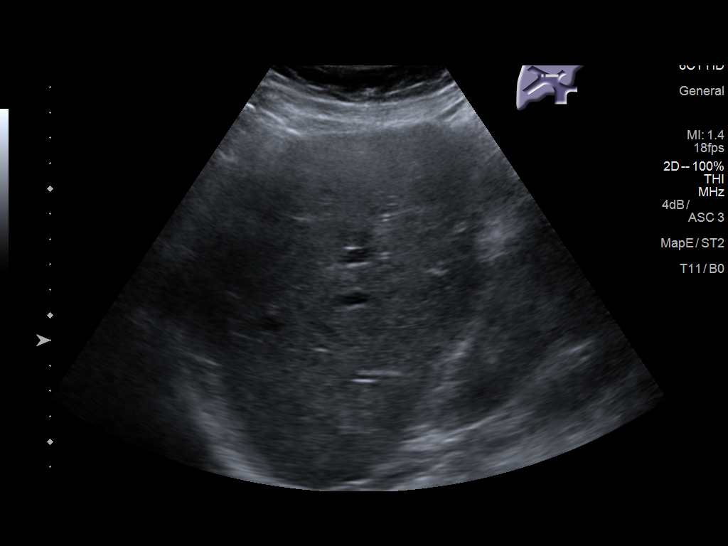
[im 37/41]
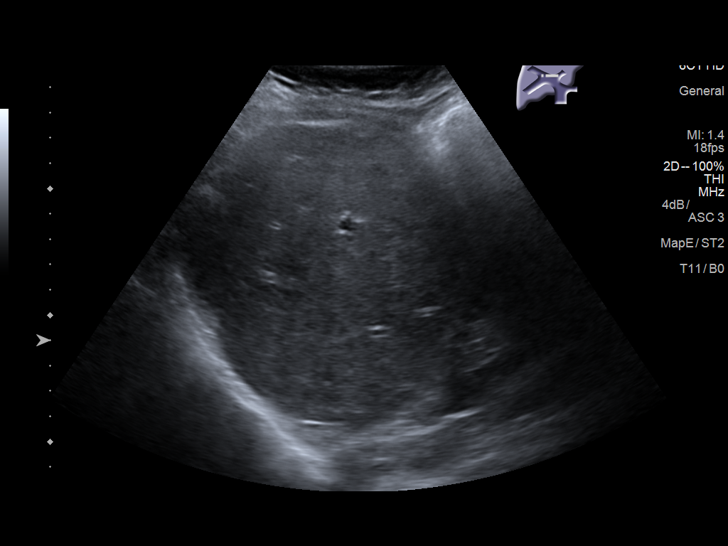
[im 41/41]
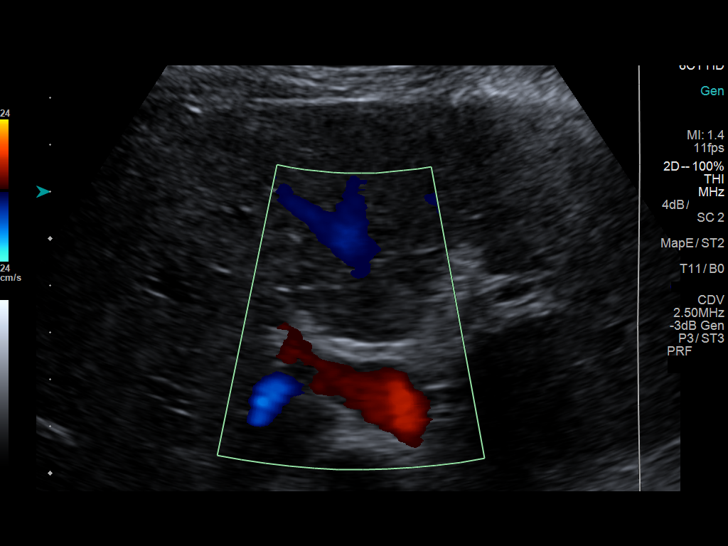

[14 of 25 positions shown; findings below may reference images not displayed]

FINDINGS: Gallbladder:

No gallstones or wall thickening visualized. No sonographic Murphy
sign noted by sonographer.

Common bile duct:

Diameter: 2 mm, normal.

Liver:

No focal lesion identified. Within normal limits in parenchymal
echogenicity. Portal vein is patent on color Doppler imaging with
normal direction of blood flow towards the liver.
IMPRESSION: Normal exam.  Specifically, the liver parenchyma appears normal.
